# Patient Record
Sex: Female | Born: 1960 | Race: White | Hispanic: No | Marital: Married
Health system: Midwestern US, Community
[De-identification: ages and names within clinical notes are randomized; demographics above are authoritative.]

## PROBLEM LIST (undated history)

## (undated) DIAGNOSIS — K589 Irritable bowel syndrome without diarrhea: Secondary | ICD-10-CM

## (undated) DIAGNOSIS — T7840XA Allergy, unspecified, initial encounter: Secondary | ICD-10-CM

## (undated) DIAGNOSIS — F419 Anxiety disorder, unspecified: Secondary | ICD-10-CM

## (undated) DIAGNOSIS — H9201 Otalgia, right ear: Secondary | ICD-10-CM

## (undated) DIAGNOSIS — Z860100 Personal history of colon polyps, unspecified: Secondary | ICD-10-CM

## (undated) DIAGNOSIS — I10 Essential (primary) hypertension: Secondary | ICD-10-CM

## (undated) HISTORY — DX: Personal history of colon polyps, unspecified: Z86.0100

## (undated) HISTORY — DX: Irritable bowel syndrome, unspecified: K58.9

## (undated) HISTORY — DX: Personal history of colonic polyps: Z86.010

## (undated) HISTORY — DX: Allergy, unspecified, initial encounter: T78.40XA

## (undated) HISTORY — DX: Essential (primary) hypertension: I10

---

## 1993-12-05 LAB — HM PAP SMEAR

## 1997-11-14 ENCOUNTER — Emergency Department (HOSPITAL_COMMUNITY): Admission: EM | Admit: 1997-11-14 | Discharge: 1997-11-14 | Payer: Self-pay | Admitting: *Deleted

## 1998-03-25 ENCOUNTER — Other Ambulatory Visit: Admission: RE | Admit: 1998-03-25 | Discharge: 1998-03-25 | Payer: Self-pay | Admitting: Obstetrics and Gynecology

## 1998-06-04 ENCOUNTER — Ambulatory Visit (HOSPITAL_COMMUNITY): Admission: RE | Admit: 1998-06-04 | Discharge: 1998-06-04 | Payer: Self-pay | Admitting: Gastroenterology

## 1999-03-28 ENCOUNTER — Other Ambulatory Visit: Admission: RE | Admit: 1999-03-28 | Discharge: 1999-03-28 | Payer: Self-pay | Admitting: Obstetrics and Gynecology

## 1999-11-16 ENCOUNTER — Encounter: Payer: Self-pay | Admitting: Obstetrics and Gynecology

## 1999-11-16 ENCOUNTER — Encounter: Admission: RE | Admit: 1999-11-16 | Discharge: 1999-11-16 | Payer: Self-pay | Admitting: Obstetrics and Gynecology

## 2000-03-27 ENCOUNTER — Other Ambulatory Visit: Admission: RE | Admit: 2000-03-27 | Discharge: 2000-03-27 | Payer: Self-pay | Admitting: Obstetrics and Gynecology

## 2000-04-19 ENCOUNTER — Ambulatory Visit (HOSPITAL_COMMUNITY): Admission: EM | Admit: 2000-04-19 | Discharge: 2000-04-19 | Payer: Self-pay | Admitting: Emergency Medicine

## 2001-02-20 ENCOUNTER — Encounter: Admission: RE | Admit: 2001-02-20 | Discharge: 2001-02-20 | Payer: Self-pay | Admitting: Obstetrics and Gynecology

## 2001-02-20 ENCOUNTER — Encounter: Payer: Self-pay | Admitting: Obstetrics and Gynecology

## 2001-04-01 ENCOUNTER — Other Ambulatory Visit: Admission: RE | Admit: 2001-04-01 | Discharge: 2001-04-01 | Payer: Self-pay | Admitting: Obstetrics and Gynecology

## 2001-09-27 ENCOUNTER — Ambulatory Visit (HOSPITAL_COMMUNITY): Admission: RE | Admit: 2001-09-27 | Discharge: 2001-09-27 | Payer: Self-pay

## 2004-07-08 ENCOUNTER — Ambulatory Visit (HOSPITAL_COMMUNITY): Admission: RE | Admit: 2004-07-08 | Discharge: 2004-07-08 | Payer: Self-pay | Admitting: Gastroenterology

## 2005-04-19 ENCOUNTER — Encounter: Admission: RE | Admit: 2005-04-19 | Discharge: 2005-04-19 | Payer: Self-pay | Admitting: Family Medicine

## 2005-05-19 ENCOUNTER — Encounter: Admission: RE | Admit: 2005-05-19 | Discharge: 2005-05-19 | Payer: Self-pay | Admitting: Family Medicine

## 2006-10-09 ENCOUNTER — Ambulatory Visit: Payer: Self-pay | Admitting: Family Medicine

## 2007-02-05 ENCOUNTER — Ambulatory Visit: Payer: Self-pay | Admitting: Family Medicine

## 2007-03-15 ENCOUNTER — Ambulatory Visit: Payer: Self-pay | Admitting: Family Medicine

## 2007-04-11 ENCOUNTER — Emergency Department (HOSPITAL_COMMUNITY): Admission: EM | Admit: 2007-04-11 | Discharge: 2007-04-11 | Payer: Self-pay | Admitting: Emergency Medicine

## 2007-04-12 ENCOUNTER — Ambulatory Visit: Payer: Self-pay | Admitting: Family Medicine

## 2007-05-16 ENCOUNTER — Ambulatory Visit: Payer: Self-pay | Admitting: Family Medicine

## 2007-06-20 ENCOUNTER — Ambulatory Visit: Payer: Self-pay | Admitting: Family Medicine

## 2007-06-25 ENCOUNTER — Ambulatory Visit: Payer: Self-pay | Admitting: Family Medicine

## 2008-02-21 ENCOUNTER — Ambulatory Visit: Payer: Self-pay | Admitting: Family Medicine

## 2008-04-13 ENCOUNTER — Ambulatory Visit: Payer: Self-pay | Admitting: Family Medicine

## 2008-04-21 ENCOUNTER — Ambulatory Visit: Payer: Self-pay | Admitting: Family Medicine

## 2008-06-24 ENCOUNTER — Ambulatory Visit: Payer: Self-pay | Admitting: Family Medicine

## 2008-07-22 ENCOUNTER — Ambulatory Visit: Payer: Self-pay | Admitting: Family Medicine

## 2008-08-03 ENCOUNTER — Ambulatory Visit: Payer: Self-pay | Admitting: Family Medicine

## 2008-09-08 ENCOUNTER — Ambulatory Visit: Payer: Self-pay | Admitting: Family Medicine

## 2008-10-20 ENCOUNTER — Ambulatory Visit: Payer: Self-pay | Admitting: Family Medicine

## 2009-01-28 ENCOUNTER — Ambulatory Visit: Payer: Self-pay | Admitting: Family Medicine

## 2009-03-08 ENCOUNTER — Ambulatory Visit: Payer: Self-pay | Admitting: Sports Medicine

## 2009-03-08 DIAGNOSIS — Q667 Congenital pes cavus, unspecified foot: Secondary | ICD-10-CM | POA: Insufficient documentation

## 2009-03-08 DIAGNOSIS — M79609 Pain in unspecified limb: Secondary | ICD-10-CM | POA: Insufficient documentation

## 2009-03-08 DIAGNOSIS — M722 Plantar fascial fibromatosis: Secondary | ICD-10-CM | POA: Insufficient documentation

## 2009-03-09 ENCOUNTER — Ambulatory Visit: Payer: Self-pay | Admitting: Family Medicine

## 2009-03-26 ENCOUNTER — Ambulatory Visit: Payer: Self-pay | Admitting: Family Medicine

## 2009-05-08 HISTORY — PX: COLONOSCOPY: SHX174

## 2009-05-11 ENCOUNTER — Ambulatory Visit: Payer: Self-pay | Admitting: Sports Medicine

## 2009-05-12 ENCOUNTER — Ambulatory Visit: Payer: Self-pay | Admitting: Family Medicine

## 2009-06-04 ENCOUNTER — Ambulatory Visit: Payer: Self-pay | Admitting: Family Medicine

## 2009-06-25 ENCOUNTER — Encounter: Admission: RE | Admit: 2009-06-25 | Discharge: 2009-06-25 | Payer: Self-pay | Admitting: Family Medicine

## 2009-06-25 ENCOUNTER — Ambulatory Visit: Payer: Self-pay | Admitting: Family Medicine

## 2009-07-21 ENCOUNTER — Ambulatory Visit: Payer: Self-pay | Admitting: Family Medicine

## 2009-08-26 ENCOUNTER — Ambulatory Visit: Payer: Self-pay | Admitting: Family Medicine

## 2009-10-07 LAB — HM COLONOSCOPY

## 2009-11-16 ENCOUNTER — Ambulatory Visit: Payer: Self-pay | Admitting: Physician Assistant

## 2010-02-14 ENCOUNTER — Ambulatory Visit: Payer: Self-pay | Admitting: Family Medicine

## 2010-04-07 ENCOUNTER — Ambulatory Visit: Payer: Self-pay | Admitting: Family Medicine

## 2010-05-05 ENCOUNTER — Ambulatory Visit
Admission: RE | Admit: 2010-05-05 | Discharge: 2010-05-05 | Payer: Self-pay | Source: Home / Self Care | Attending: Family Medicine | Admitting: Family Medicine

## 2010-05-13 ENCOUNTER — Ambulatory Visit
Admission: RE | Admit: 2010-05-13 | Discharge: 2010-05-13 | Payer: Self-pay | Source: Home / Self Care | Attending: Family Medicine | Admitting: Family Medicine

## 2010-06-09 NOTE — Assessment & Plan Note (Signed)
Summary: NP FOOT PAIN REF FROM DR Jola Babinski OFF/[facility]   Vital Signs:  Patient profile:   50 year old female Height:      60 inches Weight:      125 pounds BMI:     24.50 BP sitting:   151 / 88  Vitals Entered By: Lillia Pauls CMA (March 08, 2009 10:51 AM)  History of Present Illness: Very pleasant lady referred courtesy of Dr Susann Givens she has had persistent left heel and arch pain This started without changing much of her training she normally runs 30 mins at a time 3 to 4 times per week some shoes without sarch support would cause pain as well pain on left was extesning up into lower leg and sometimes into mid arch  no swelling  RT heel was painful on occassion sometimes tight in morning minimal pain now  stiffenss first thing in morning meds on real help tennis ball massage some help  Physical Exam  General:  Well-developed,well-nourished,in no acute distress; alert,appropriate and cooperative throughout examination Msk:  Cavus foot bilat with high arch Left TTP left heel at medial insertion fat pad is OK testing of medial and lat tendons reveal good strength some tenderness in mid arch to stretch of PF AT is OK  RT minimal tenderness to touch AT is OK no pain on stretch Extremities:  Gait is high impact loud foot strike mid foot to forefoot impact supination bilat  note this feels worse in new shoes and OTC orthotics given to her   Impression & Recommendations:  Problem # 1:  FOOT PAIN, BILATERAL (ICD-729.5)  This has improved with shift back to old shoes will ask her  to use ice bath on left daily for pain relief  Orders: Sports Insoles (H0623)  Problem # 2:  PLANTAR FASCIITIS, LEFT (ICD-728.71)  This is moderate on left RT is more just arch strain at this point try arch strap given sports insoles and removed OTC orthotics  given std stretches and exercises gradual return to running I want to reck in 6 wks and if not improved would do [place]  scan  note she was able to run pretty comfortably with sports insole and strap when we placed these  Orders: Sports Insoles (L3510)  Problem # 3:  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

## 2010-06-09 NOTE — Assessment & Plan Note (Signed)
Summary: FU PLANTAR FASCIITIS AND NEW L GREAT TOE "SENSATION"   Vital Signs:  Patient profile:   50 year old female BP sitting:   121 / 87  Vitals Entered By: Lillia Pauls CMA (May 11, 2009 9:16 AM)  History of Present Illness: Left foot PF much better since last visit new complaint of some tingling on dorsum of left foot since we added scahphoid pad able to run without pain pain on days is now down to 2/3 out of 10 and absent some days mroning pain is much less and she does a stretch in bed and does not feel it  hard shoes will bring pain back out when standing  Physical Exam  General:  Well-developed,well-nourished,in no acute distress; alert,appropriate and cooperative throughout examination Msk:  minimal tenderness at insertion of PF has mild TMT bossing arch preserved no tenderness over AT or calcaneus   Impression & Recommendations:  Problem # 1:  PLANTAR FASCIITIS, LEFT (ICD-728.71) cont exercises as before at least 3 times per week do daily stretch as this helps given 2nd comforthotic and information about ordeing this and placing scaphoid pad loosen laces over TMT/ add B6 if numbness persists  if doing this well, gradually increase runnign and activity if stable can then return as needed but would suggest cont use of arch support  Problem # 2:  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

## 2010-06-09 NOTE — Assessment & Plan Note (Signed)
Summary: 9:30 appt for heel pain/hx plantar fasciitis/[facility]   Vital Signs:  Patient profile:   50 year old female BP sitting:   135 / 84  History of Present Illness: 50 yo F here for 2 1/2 week f/u of L plantar fasciitis.  Patient doing ice bath daily, wearing arch strap, sports insoles.  Also doing stretches and eccentric exercises.  Feels pain is the same or worse.  Would not like a shot at this time.  Unable to run because of pain.  Radiates across her foot.  Been present for about 3 months.  Physical Exam  General:  Well-developed,well-nourished,in no acute distress; alert,appropriate and cooperative throughout examination Msk:  Cavus foot bilaterally with high arch  Left No gross deformity, swelling, bruising. TTP left heel at medial insertion PF negative calcaneal squeeze and no TTP fat pad FROM with 5/5 strength all motions. some tenderness in mid arch to stretch of PF AT nontender   Impression & Recommendations:  Problem # 1:  PLANTAR FASCIITIS, LEFT (ICD-728.71) Assessment Deteriorated Additional felt lift added to left sports insole as well as a small scaphoid pad to help support the arch.  Given extra scaphoid pad to put on right side if starts to bother her on this side as well.  These felt comfortable to her.  Stressed importance of eccentric exercises, arch pad to help with short term pain.  Discussed natural history of plantar fasciitis.  Should stay as active as possible.  declined shot but advised we can try this if pain too severe.  F/u in 6 weeks otherwise.  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

## 2010-07-06 ENCOUNTER — Ambulatory Visit (INDEPENDENT_AMBULATORY_CARE_PROVIDER_SITE_OTHER): Payer: BC Managed Care – PPO | Admitting: Family Medicine

## 2010-07-06 DIAGNOSIS — J309 Allergic rhinitis, unspecified: Secondary | ICD-10-CM

## 2010-07-06 DIAGNOSIS — J0100 Acute maxillary sinusitis, unspecified: Secondary | ICD-10-CM

## 2010-09-23 NOTE — Procedures (Signed)
[facility]  Patient:    Desiree Yu, Desiree Yu                        MRN: [id number] Proc. Date: 04/19/00 Adm. Date:  [id number] Attending:  Donnetta Hutching CC:         Petra Kuba, M.D.  Ronnald Nian, M.D.   Procedure Report  PROCEDURE:  Video sigmoidoscopy.  INDICATIONS FOR PROCEDURE:  A 50 year old female with hematochezia and left lower quadrant abdominal pain and a reported history of prior ischemic colitis.  PREPARATION:  Two Fleets enemas administered in the GI unit.  SEDATION:  None.  DESCRIPTION OF PROCEDURE:  The Olympus video colonoscope was used, it was inserted via the rectum and advanced easily to 60 cm which appeared to be the splenic flexure. On withdrawal, the mucosa was carefully evaluated and found to be entirely normal. There were no signs of inflammation, diverticulosis, or ischemia. There was no neoplasia. Retroflexed view of the rectum was unremarkable. On withdrawal through the anal canal, there was apparently a small posterior fissure.  IMPRESSION:  Probable irritable bowel syndrome symptoms with an acute fissure accounting for bleeding.  PLAN:  High fiber diet, sitz baths, and Anusol suppositories are prescribed. Return to the office to follow-up with Dr. Ewing Schlein within the next few weeks. DD:  04/19/00 TD:  04/20/00 Job: [id number]

## 2010-09-23 NOTE — Op Note (Signed)
NAMEZHANA, JEANGILLES                 ACCOUNT NO.:  [id number]   MEDICAL RECORD NO.:  [id number]          PATIENT TYPE:  AMB   LOCATION:  ENDO                         FACILITY:  [facility]   PHYSICIAN:  Petra Kuba, M.D.    DATE OF BIRTH:  May 24, 1960   DATE OF PROCEDURE:  07/08/2004  DATE OF DISCHARGE:                                 OPERATIVE REPORT   PROCEDURE:  Colonoscopy.   INDICATIONS FOR PROCEDURE:  Change in bowel habits, family history of colon  polyps.   Consent was signed after risks, benefits, methods, and options were  thoroughly discussed in the office.   MEDICINES USED:  Demerol 90, Versed 9.   DESCRIPTION OF PROCEDURE:  Rectal inspection was pertinent for small  external hemorrhoids. Digital exam was negative. The pediatric video  adjustable colonoscope was inserted and fairly easily with some abdominal  pressure advanced around the colon to the cecum. No abnormalities was seen  on insertion. The cecum was identified by the appendiceal orifice and the  ileocecal valve. In fact, the scope was inserted a short ways into the  terminal ileum which was normal. Photo documentation was obtained. The scope  was slowly withdrawn.  On slow withdrawal back to the rectum, no  abnormalities were seen. Specifically, no polyp, tumors or masses or  diverticula.  Once back in the rectum, anal rectal pullthrough and  retroflexion confirmed some small hemorrhoids. The scope was straightened  and readvanced a short ways up the left side of the colon, air was  suctioned, scope removed. The patient tolerated the procedure well. There  was no obvious or immediate complications.   ENDOSCOPIC DIAGNOSIS:  1.  Internal and external small hemorrhoids.  2.  Otherwise within normal limits to the cecum and the terminal ileum.   PLAN:  Recheck screening in five years, happy to see back p.r.n. Return care  to Dr. Susann Givens for the customary health care screening to include yearly  rectals and  guaiacs.      MEM/MEDQ  D:  07/08/2004  T:  07/08/2004  Job:  [id number]   cc:   Sharlot Gowda, M.D.  [address]  [place], [place] [address]  Fax: [phone]

## 2010-09-23 NOTE — Consult Note (Signed)
[facility]  Patient:    Desiree Yu, Desiree Yu                        MRN: [id number] Proc. Date: 04/19/00 Adm. Date:  [id number] Attending:  Donnetta Hutching CC:         Ronnald Nian, M.D.  Petra Kuba, M.D.   Consultation Report  HISTORY OF PRESENT ILLNESS:  Desiree Yu is a 50 year old female who presents with acute onset of abdominal cramping with diarrhea.  Following the diarrhea, she had further diarrhea with four episodes of hematochezia and left lower quadrant abdominal pain.  She came to the emergency room because of this problem, in particular, because she has a reported history of ischemic colitis two years ago.  She is a former smoker but has not smoked for 10 years.  She did not have any fevers or chills.  When she had her cramping she said she felt nauseous but did not vomit.  In the emergency room her hemoglobin was 13.4.  She has been treated with antispasmodic medications, one of which is Anaspaz.  She was last seen six weeks ago by Dr. Ewing Schlein and outpatient stool guaiac tests were negative last week.  PHYSICAL EXAMINATION:  GENERAL:  She is in no acute distress.  VITAL SIGNS:  Afebrile with a blood pressure of 119/80, pulse 100 and regular.  HEENT:  Conjunctivae are pink.  CHEST:  Clear.  HEART:  Heart sounds are regular rate and rhythm without murmurs.  ABDOMEN:  Soft with minimal left lower quadrant tenderness to deep palpation. There is no rebound or hernia.  Bowel sounds are normal  RECTAL EXAM:  Brown stool.  Mucous is guaiac positive.  There is a small posterior anal fissure.  IMPRESSION:  Probable irritable bowel syndrome flare with fissure accounting for bleeding, however, in light of her history of ischemic colitis, I do believe she should undergo a sigmoidoscopy to make sure there is no further lesion found.  This will be arranged as soon as possible. DD:  04/19/00 TD:  04/20/00 Job: [id number]

## 2010-11-10 ENCOUNTER — Encounter: Payer: Self-pay | Admitting: Family Medicine

## 2010-11-11 ENCOUNTER — Ambulatory Visit (INDEPENDENT_AMBULATORY_CARE_PROVIDER_SITE_OTHER): Payer: BC Managed Care – PPO | Admitting: Family Medicine

## 2010-11-11 ENCOUNTER — Encounter: Payer: Self-pay | Admitting: Family Medicine

## 2010-11-11 VITALS — BP 150/100 | HR 68 | Wt 126.0 lb

## 2010-11-11 DIAGNOSIS — J301 Allergic rhinitis due to pollen: Secondary | ICD-10-CM

## 2010-11-11 DIAGNOSIS — E785 Hyperlipidemia, unspecified: Secondary | ICD-10-CM

## 2010-11-11 MED ORDER — AZELASTINE HCL 0.1 % NA SOLN: 2.0000 | Freq: Two times a day (BID) | NASAL | Status: DC

## 2010-11-11 MED ORDER — MOMETASONE FUROATE 50 MCG/ACT NA SUSP: 2.0000 | Freq: Every day | NASAL | Status: DC

## 2010-11-11 MED ORDER — ATORVASTATIN CALCIUM 20 MG PO TABS: 20.0000 mg | ORAL_TABLET | Freq: Every day | ORAL | Status: DC

## 2010-11-11 MED ORDER — MONTELUKAST SODIUM 10 MG PO TABS: 10.0000 mg | ORAL_TABLET | Freq: Every day | ORAL | Status: DC

## 2010-11-11 NOTE — Patient Instructions (Signed)
Use the medication for one month and then call me. If you keep having difficulty, I will refer to an allergist

## 2010-11-11 NOTE — Progress Notes (Signed)
  Subjective:    Patient ID: Desiree Yu, female    DOB: 09/12/1960, 49 y.o.   MRN: [id number]  HPI She is here for evaluation of continued difficulty with sinus congestion, PND, nasal congestion, popping in the ears. She notes this is especially true at night. Flonase does not give her complete relief. The symptoms have been especially troublesome this year. She does not smoke. Has tried loratadine, Allegra and Zyrtec. No animals in the house. The symptoms also occur when she is out of town. She would also like a refill on her Lipitor. She is doing well on this medicine.  Review of Systems     Objective:   Physical Exam alert and in no distress. Tympanic membranes and canals are normal. Throat is clear. Tonsils are normal. Neck is supple without adenopathy or thyromegaly. Cardiac exam shows a regular sinus rhythm without murmurs or gallops. Lungs are clear to auscultation. Nasal mucosa is normal with no tenderness over sinuses        Assessment & Plan:  Allergic rhinitis. Hyperlipidemia. I will place her on Astelin and gave proper instructions on use of this medicine and potential problems. Will also give her Singulair and switch her to Nasonex. Lipitor was also called in.

## 2010-12-01 ENCOUNTER — Other Ambulatory Visit: Payer: Self-pay | Admitting: Family Medicine

## 2010-12-09 ENCOUNTER — Encounter: Payer: Self-pay | Admitting: Family Medicine

## 2010-12-19 ENCOUNTER — Telehealth: Payer: Self-pay | Admitting: Family Medicine

## 2010-12-29 NOTE — Telephone Encounter (Deleted)
JCL HAS THIS BEEN TAKEN CARE OF?

## 2010-12-30 NOTE — Telephone Encounter (Signed)
done

## 2011-01-11 ENCOUNTER — Encounter: Payer: Self-pay | Admitting: Family Medicine

## 2011-01-11 ENCOUNTER — Ambulatory Visit (INDEPENDENT_AMBULATORY_CARE_PROVIDER_SITE_OTHER): Payer: BC Managed Care – PPO | Admitting: Family Medicine

## 2011-01-11 VITALS — BP 140/90 | HR 84 | Temp 98.8°F | Wt 128.0 lb

## 2011-01-11 DIAGNOSIS — J301 Allergic rhinitis due to pollen: Secondary | ICD-10-CM

## 2011-01-11 NOTE — Progress Notes (Signed)
  Subjective:    Patient ID: Desiree Yu, female    DOB: 30-Mar-1961, 50 y.o.   MRN: [id number]  HPI She continues to have difficulty with nasal congestion, postnasal drainage. She continues on Astelin as well as Nasonex. She did try Singulair and states it had very little effect.   Review of Systems     Objective:   Physical Exam alert and in no distress. Tympanic membranes and canals are normal. Throat is clear. Tonsils are normal. Neck is supple without adenopathy or thyromegaly.        Assessment & Plan:  Allergic rhinitis Refer to allergist for further evaluation and treatment.

## 2011-02-13 LAB — COMPREHENSIVE METABOLIC PANEL
ALT: 18
AST: 24
Albumin: 3.9
Alkaline Phosphatase: 43
BUN: 13
CO2: 25
Calcium: 9.3
Chloride: 105
Creatinine, Ser: 0.68
GFR calc Af Amer: >60
GFR calc non Af Amer: >60
Glucose, Bld: 109 — ABNORMAL HIGH
Potassium: 3.8
Sodium: 138
Total Bilirubin: 1.2
Total Protein: 6.9

## 2011-02-13 LAB — URINE MICROSCOPIC-ADD ON

## 2011-02-13 LAB — URINALYSIS, ROUTINE W REFLEX MICROSCOPIC
Bilirubin Urine: NEGATIVE
Glucose, UA: NEGATIVE
Ketones, ur: NEGATIVE
Nitrite: NEGATIVE
Protein, ur: 100 — AB
Specific Gravity, Urine: 1.024
Urobilinogen, UA: 0.2
pH: 6.5

## 2011-02-13 LAB — CBC
HCT: 41.2
Hemoglobin: 14.1
MCHC: 34.3
MCV: 96
Platelets: 366
RBC: 4.29
RDW: 12.7
WBC: 8.1

## 2011-02-13 LAB — LIPASE, BLOOD: Lipase: 22

## 2011-02-13 LAB — POCT PREGNANCY, URINE
Operator id: 19830
Preg Test, Ur: NEGATIVE

## 2011-04-17 ENCOUNTER — Encounter: Payer: Self-pay | Admitting: Family Medicine

## 2011-04-17 ENCOUNTER — Ambulatory Visit (INDEPENDENT_AMBULATORY_CARE_PROVIDER_SITE_OTHER): Payer: BC Managed Care – PPO | Admitting: Family Medicine

## 2011-04-17 VITALS — BP 130/78 | HR 78 | Wt 130.0 lb

## 2011-04-17 DIAGNOSIS — M549 Dorsalgia, unspecified: Secondary | ICD-10-CM

## 2011-04-17 DIAGNOSIS — M546 Pain in thoracic spine: Secondary | ICD-10-CM

## 2011-04-17 MED ORDER — CARISOPRODOL 350 MG PO TABS: 350.0000 mg | ORAL_TABLET | Freq: Three times a day (TID) | ORAL | Status: AC | PRN

## 2011-04-17 NOTE — Patient Instructions (Signed)
Use heat and stretching, uses soma mainly at night. Return here for possible injection if not improved

## 2011-04-17 NOTE — Progress Notes (Signed)
  Subjective:    Patient ID: Desiree Yu, female    DOB: 1961/04/12, 50 y.o.   MRN: [id number]  HPI She is here for evaluation of continued difficulty with upper back discomfort. She has seen her massage therapist and has had no luck in eliminating the pain. It sometimes will cause radiation of the pain.   Review of Systems     Objective:   Physical Exam Alert and in no distress. Exam of her upper back does show some palpable trigger points.      Assessment & Plan:  Triggerpoints. Discussed heat, stretching and will use soma. If she continues to have difficulty did mention the possibility of an injection. She will let he know if she does not respond to the soma.

## 2011-05-10 ENCOUNTER — Encounter: Payer: Self-pay | Admitting: Family Medicine

## 2011-05-10 ENCOUNTER — Ambulatory Visit
Admission: RE | Admit: 2011-05-10 | Discharge: 2011-05-10 | Disposition: A | Discharge status: Home / Self Care | Payer: BC Managed Care – PPO | Source: Ambulatory Visit | Attending: Family Medicine | Admitting: Family Medicine

## 2011-05-10 ENCOUNTER — Ambulatory Visit (INDEPENDENT_AMBULATORY_CARE_PROVIDER_SITE_OTHER): Payer: BC Managed Care – PPO | Admitting: Family Medicine

## 2011-05-10 VITALS — BP 122/80 | HR 78 | Wt 129.0 lb

## 2011-05-10 DIAGNOSIS — G8929 Other chronic pain: Secondary | ICD-10-CM

## 2011-05-10 DIAGNOSIS — M549 Dorsalgia, unspecified: Secondary | ICD-10-CM

## 2011-05-10 NOTE — Progress Notes (Signed)
  Subjective:    Patient ID: Desiree Yu, female    DOB: 28-Dec-1960, 51 y.o.   MRN: [id number]  HPI She is here for recheck. She continues to have difficulty with right upper back and scapular discomfort. She has not obtained any relief with using massage or stretching. She has no chest pain, shortness of breath, DOE.  Review of Systems     Objective:   Physical Exam Alert and in no distress. Lungs are clear to auscultation. Exam of her back shows no palpable lesions.       Assessment & Plan:   1. Upper back pain, chronic  DG Chest 2 View   if the x-ray is negative, I will have her see if chiropractic manipulation will help. Followup as needed

## 2011-05-10 NOTE — Patient Instructions (Signed)
If the x-ray is negative, see Damien Rudolpho.

## 2011-06-01 ENCOUNTER — Telehealth: Payer: Self-pay | Admitting: Family Medicine

## 2011-06-01 NOTE — Telephone Encounter (Signed)
Go ahead and draw labs and  I'll look at them and call her

## 2011-06-02 NOTE — Telephone Encounter (Signed)
Left message for pt to come in and get blood draw per jcl

## 2011-06-13 ENCOUNTER — Other Ambulatory Visit: Payer: BC Managed Care – PPO

## 2011-06-13 DIAGNOSIS — E785 Hyperlipidemia, unspecified: Secondary | ICD-10-CM

## 2011-06-13 LAB — LIPID PANEL
Cholesterol: 163 mg/dL (ref 0–200)
HDL: 74 mg/dL (ref >39)
LDL Cholesterol: 75 mg/dL (ref 0–99)
Total CHOL/HDL Ratio: 2.2 Ratio
Triglycerides: 72 mg/dL (ref <150)
VLDL: 14 mg/dL (ref 0–40)

## 2011-06-13 LAB — HEPATIC FUNCTION PANEL
ALT: 18 U/L (ref 0–35)
AST: 26 U/L (ref 0–37)
Albumin: 4.8 g/dL (ref 3.5–5.2)
Alkaline Phosphatase: 55 U/L (ref 39–117)
Bilirubin, Direct: 0.2 mg/dL (ref 0.0–0.3)
Indirect Bilirubin: 0.8 mg/dL (ref 0.0–0.9)
Total Bilirubin: 1 mg/dL (ref 0.3–1.2)
Total Protein: 7.1 g/dL (ref 6.0–8.3)

## 2011-06-30 ENCOUNTER — Other Ambulatory Visit: Payer: Self-pay | Admitting: Family Medicine

## 2011-06-30 MED ORDER — ATORVASTATIN CALCIUM 20 MG PO TABS: 20.0000 mg | ORAL_TABLET | Freq: Every day | ORAL | Status: DC

## 2011-06-30 NOTE — Telephone Encounter (Signed)
Pt called and said at time of her labs, that her Lipitor was not refilled.  Please refill to [facility] on [address].

## 2011-12-07 ENCOUNTER — Encounter: Payer: Self-pay | Admitting: Family Medicine

## 2011-12-26 ENCOUNTER — Ambulatory Visit (INDEPENDENT_AMBULATORY_CARE_PROVIDER_SITE_OTHER): Payer: BC Managed Care – PPO | Admitting: Family Medicine

## 2011-12-26 ENCOUNTER — Other Ambulatory Visit: Payer: Self-pay

## 2011-12-26 ENCOUNTER — Encounter: Payer: Self-pay | Admitting: Family Medicine

## 2011-12-26 VITALS — BP 112/70 | HR 74 | Wt 133.0 lb

## 2011-12-26 DIAGNOSIS — M549 Dorsalgia, unspecified: Secondary | ICD-10-CM

## 2011-12-26 MED ORDER — CYCLOBENZAPRINE HCL 5 MG PO TABS: 5.0000 mg | ORAL_TABLET | Freq: Three times a day (TID) | ORAL | Status: AC | PRN

## 2011-12-26 NOTE — Patient Instructions (Signed)
Use of Flexeril 2 or 3 times a day depending upon, sedation. You can't take this while driving.

## 2011-12-26 NOTE — Progress Notes (Signed)
  Subjective:    Patient ID: Desiree Yu, female    DOB: [birth year], 51 y.o.   MRN: [id number]  HPI She is here for consultation. She continues to have upper back and shoulder discomfort. She has been seeing a chiropractor regularly for the last several months. He referred her to Dr. Modesto Charon who prescribed Mobic and Skelaxin which had minimal relief. She is also had an MRI on her upper back which apparently did not show much. She was given Flexeril by her gynecologist and states that this helped more than anything. She is now going to [facility] help. She is interested in my thoughts on this.   Review of Systems     Objective:   Physical Exam Alert and in no distress otherwise not examined       Assessment & Plan:   1. Back pain  cyclobenzaprine (FLEXERIL) 5 MG tablet   approximately 15 minutes spent discussing all these issues with her. I recommended that she continue on Flexeril on a regular basis to see if a more continuous use of this will have more benefit. Also discussed the possibility of seeing a different chiropractor did go for more manipulation is post immobilization. Gave her names of several chiropractors. I will followup based on her need.

## 2011-12-26 NOTE — Telephone Encounter (Signed)
CALLED FLEXAREL IN PER JCL

## 2012-01-05 ENCOUNTER — Other Ambulatory Visit: Payer: Self-pay | Admitting: Family Medicine

## 2012-05-21 ENCOUNTER — Ambulatory Visit (INDEPENDENT_AMBULATORY_CARE_PROVIDER_SITE_OTHER): Payer: BC Managed Care – PPO | Admitting: Family Medicine

## 2012-05-21 ENCOUNTER — Encounter: Payer: Self-pay | Admitting: Family Medicine

## 2012-05-21 VITALS — BP 130/78 | HR 80 | Wt 138.0 lb

## 2012-05-21 DIAGNOSIS — M549 Dorsalgia, unspecified: Secondary | ICD-10-CM

## 2012-05-21 MED ORDER — VENLAFAXINE HCL 37.5 MG PO TABS: 37.5000 mg | ORAL_TABLET | Freq: Two times a day (BID) | ORAL | Status: DC

## 2012-05-21 MED ORDER — VENLAFAXINE HCL 75 MG PO TABS: 75.0000 mg | ORAL_TABLET | Freq: Two times a day (BID) | ORAL | Status: DC

## 2012-05-21 NOTE — Patient Instructions (Signed)
Take the 37.5 mg pills for the first week and then go to 75 mg pill and then I will see you in roughly a month. You can take Tylenol/Advil as well.

## 2012-05-21 NOTE — Progress Notes (Signed)
  Subjective:    Patient ID: Desiree Yu, female    DOB: [birth year], 52 y.o.   MRN: [id number]  HPI She is here for consult concerning continued difficulty with mid back pain. Review his record indicates she has been seen by physical therapy, orthopedics, chiropractic as well as having massage therapy done. She also notes that stress does play a role in this. She did respond with some relief of her symptoms when she was given a lorazepam. She is interested in any further options that she has.   Review of Systems     Objective:   Physical Exam Alert and slightly tearful. Back exam not done.       Assessment & Plan:   1. Mid back pain  venlafaxine (EFFEXOR) 37.5 MG tablet, venlafaxine (EFFEXOR) 75 MG tablet   after discussion with her concerning options we have decided to try Effexor to see if this will help with her pain. Also discussed use of Cymbalta. Discussed also possibility of reexamining her in regard to possible MRI and looking in other areas of her back as to the source of her midthoracic pain. He mentioned the concept of closed chain. I will start her on Effexor 37.5 twice a day increasing to 75 twice a day. She will return here in one month.

## 2012-06-07 ENCOUNTER — Encounter: Payer: Self-pay | Admitting: Internal Medicine

## 2012-06-21 ENCOUNTER — Ambulatory Visit: Payer: BC Managed Care – PPO | Admitting: Family Medicine

## 2012-07-30 ENCOUNTER — Ambulatory Visit (INDEPENDENT_AMBULATORY_CARE_PROVIDER_SITE_OTHER): Payer: BC Managed Care – PPO | Admitting: Family Medicine

## 2012-07-30 ENCOUNTER — Encounter: Payer: Self-pay | Admitting: Family Medicine

## 2012-07-30 VITALS — BP 130/90 | HR 89 | Wt 137.0 lb

## 2012-07-30 DIAGNOSIS — E785 Hyperlipidemia, unspecified: Secondary | ICD-10-CM

## 2012-07-30 DIAGNOSIS — M549 Dorsalgia, unspecified: Secondary | ICD-10-CM

## 2012-07-30 MED ORDER — VENLAFAXINE HCL 75 MG PO TABS: 75.0000 mg | ORAL_TABLET | Freq: Two times a day (BID) | ORAL | Status: DC

## 2012-07-30 NOTE — Progress Notes (Signed)
  Subjective:    Patient ID: Desiree Yu, female    DOB: 10-Dec-1960, 52 y.o.   MRN: [id number]  HPI She is here for recheck on back pain. She states she is getting better. She is using massage, yoga and  an exercise program.she also needs a refill on her Effexor. She does think that it helps make her more calm and relaxed. She also stopped taking Lipitor thinking that might contribute to her back pain. She has been making dietary changes specifically eating more oatmeal.   Review of Systems     Objective:   Physical Exam Alert and in no distress otherwise not examined       Assessment & Plan:  Mid back pain  Hyperlipidemia LDL goal < 100 - Plan: Lipid panel encouraged her to continue with her present regimen for the back. Discussed possibly stopping the Effexor only after she is pain free for several months. Also discussed the fact that I would not stop this in the holidays. I will see how her new diet has worked on her lipid panel.

## 2012-07-31 LAB — LIPID PANEL
Cholesterol: 263 mg/dL — ABNORMAL HIGH (ref 0–200)
HDL: 69 mg/dL (ref >39)
LDL Cholesterol: 170 mg/dL — ABNORMAL HIGH (ref 0–99)
Total CHOL/HDL Ratio: 3.8 Ratio
Triglycerides: 121 mg/dL (ref <150)
VLDL: 24 mg/dL (ref 0–40)

## 2012-08-01 NOTE — Progress Notes (Signed)
Quick Note:  CALLED PT CELL # LEFT RESULTS OF   Her lipids are not at goal. Have her start back on Lipitor or if she would like we can try Crestor and then we'll need to recheck that in 2 months  TO CALL ME BACK AND LET ME KNOW WHAT SHE WOULD LIKE TO DO  ______

## 2012-08-05 ENCOUNTER — Other Ambulatory Visit: Payer: Self-pay

## 2012-08-05 MED ORDER — ATORVASTATIN CALCIUM 20 MG PO TABS: ORAL_TABLET | ORAL | Status: DC

## 2012-08-05 NOTE — Telephone Encounter (Signed)
PT CALLED ON 3/27 LEFT MESSAGE THAT SHE WANTED TO GO BACK ON LIPITOR SO I SENT MED IN PER JCL

## 2012-11-05 ENCOUNTER — Ambulatory Visit (INDEPENDENT_AMBULATORY_CARE_PROVIDER_SITE_OTHER): Payer: BC Managed Care – PPO | Admitting: Family Medicine

## 2012-11-05 ENCOUNTER — Encounter: Payer: Self-pay | Admitting: Family Medicine

## 2012-11-05 VITALS — BP 120/84 | HR 76 | Wt 137.0 lb

## 2012-11-05 DIAGNOSIS — M549 Dorsalgia, unspecified: Secondary | ICD-10-CM

## 2012-11-05 DIAGNOSIS — E785 Hyperlipidemia, unspecified: Secondary | ICD-10-CM

## 2012-11-05 DIAGNOSIS — Z79899 Other long term (current) drug therapy: Secondary | ICD-10-CM

## 2012-11-05 LAB — COMPREHENSIVE METABOLIC PANEL
ALT: 15 U/L (ref 0–35)
AST: 20 U/L (ref 0–37)
Albumin: 4.6 g/dL (ref 3.5–5.2)
Alkaline Phosphatase: 68 U/L (ref 39–117)
BUN: 12 mg/dL (ref 6–23)
CO2: 25 mEq/L (ref 19–32)
Calcium: 9.6 mg/dL (ref 8.4–10.5)
Chloride: 103 mEq/L (ref 96–112)
Creat: 0.53 mg/dL (ref 0.50–1.10)
Glucose, Bld: 85 mg/dL (ref 70–99)
Potassium: 4.2 mEq/L (ref 3.5–5.3)
Sodium: 137 mEq/L (ref 135–145)
Total Bilirubin: 0.7 mg/dL (ref 0.3–1.2)
Total Protein: 7 g/dL (ref 6.0–8.3)

## 2012-11-05 LAB — LIPID PANEL
Cholesterol: 165 mg/dL (ref 0–200)
HDL: 80 mg/dL (ref >39)
LDL Cholesterol: 75 mg/dL (ref 0–99)
Total CHOL/HDL Ratio: 2.1 Ratio
Triglycerides: 52 mg/dL (ref <150)
VLDL: 10 mg/dL (ref 0–40)

## 2012-11-05 MED ORDER — ESZOPICLONE 3 MG PO TABS: 3.0000 mg | ORAL_TABLET | Freq: Every day | ORAL | Status: DC

## 2012-11-05 NOTE — Progress Notes (Signed)
  Subjective:    Patient ID: Desiree Yu, female    DOB: 04/25/1961, 52 y.o.   MRN: [id number]  HPI She is here for consultation. She is getting ready to go on a trip to [place] and would like some sleep medications to help with the transition. She also states that her back pains are slowly getting better and has backed off on her Effexor. She finds that a combination of therapies including massage, yoga and stretching has helped. She has tried acupuncture without much relief. She was also started on Lipitor in March for treatment of hyperlipidemia. She's had no difficulties with this medication and is here for recheck on that.  Review of Systems     Objective:   Physical Exam Alert and in no distress with appropriate affect       Assessment & Plan:  Hyperlipidemia LDL goal < 100 - Plan: Comprehensive metabolic panel, Lipid panel  Encounter for long-term (current) use of other medications - Plan: Comprehensive metabolic panel, Lipid panel  Mid back pain  sample of Lunesta given to help with her trip. Encouraged her to continue with her present regimen concerning her back.

## 2012-11-06 ENCOUNTER — Other Ambulatory Visit: Payer: Self-pay

## 2012-11-06 MED ORDER — ATORVASTATIN CALCIUM 20 MG PO TABS: ORAL_TABLET | ORAL | Status: DC

## 2012-11-06 NOTE — Progress Notes (Signed)
Quick Note:  MAILED PT LETTER WITH RESULTS AND SENT MED IN FOR 1 YEAR ______

## 2012-11-06 NOTE — Telephone Encounter (Signed)
SENT STATIN IN FOR 1 YEAR

## 2013-01-24 ENCOUNTER — Ambulatory Visit (INDEPENDENT_AMBULATORY_CARE_PROVIDER_SITE_OTHER): Payer: BC Managed Care – PPO | Admitting: Family Medicine

## 2013-01-24 ENCOUNTER — Encounter: Payer: Self-pay | Admitting: Family Medicine

## 2013-01-24 VITALS — BP 120/84 | HR 90 | Wt 139.0 lb

## 2013-01-24 DIAGNOSIS — Z23 Encounter for immunization: Secondary | ICD-10-CM

## 2013-01-24 DIAGNOSIS — M659 Synovitis and tenosynovitis, unspecified: Secondary | ICD-10-CM

## 2013-01-24 DIAGNOSIS — M67361 Transient synovitis, right knee: Secondary | ICD-10-CM

## 2013-01-24 NOTE — Progress Notes (Signed)
  Subjective:    Patient ID: Desiree Yu, female    DOB: 24-Feb-1961, 52 y.o.   MRN: [id number]  HPI She complains of a two-day history of swelling in the right knee. No history of injury or other joints involved. No popping, locking or grinding.    Review of Systems     Objective:   Physical Exam The right knee is not hot tender or red. An effusion is noted. No joint tenderness. No pain on palpation of the patella. Anterior drawer and McMurray's testing negative.       Assessment & Plan:  Transient synovitis of right knee  Need for prophylactic vaccination and inoculation against influenza - Plan: Flu Vaccine QUAD 36+ mos IM  recommend 2 Aleve twice a day for the next 10 days. If no improvement, reevaluation

## 2013-01-24 NOTE — Patient Instructions (Signed)
Take 2 Aleve twice a day for the next 10 days and is still having symptoms, make another appointment.

## 2013-06-12 ENCOUNTER — Ambulatory Visit (INDEPENDENT_AMBULATORY_CARE_PROVIDER_SITE_OTHER): Payer: BC Managed Care – PPO | Admitting: Family Medicine

## 2013-06-12 ENCOUNTER — Encounter: Payer: Self-pay | Admitting: Family Medicine

## 2013-06-12 VITALS — BP 130/72 | HR 80 | Wt 141.0 lb

## 2013-06-12 DIAGNOSIS — E782 Mixed hyperlipidemia: Secondary | ICD-10-CM

## 2013-06-12 DIAGNOSIS — F4389 Other reactions to severe stress: Secondary | ICD-10-CM

## 2013-06-12 DIAGNOSIS — Z569 Unspecified problems related to employment: Secondary | ICD-10-CM

## 2013-06-12 DIAGNOSIS — Z566 Other physical and mental strain related to work: Secondary | ICD-10-CM

## 2013-06-12 DIAGNOSIS — F438 Other reactions to severe stress: Secondary | ICD-10-CM

## 2013-06-12 MED ORDER — ALPRAZOLAM 0.25 MG PO TABS: 0.2500 mg | ORAL_TABLET | Freq: Two times a day (BID) | ORAL | Status: DC | PRN

## 2013-06-12 NOTE — Progress Notes (Signed)
   Subjective:    Patient ID: Desiree Yu, female    DOB: July 18, 1960, 53 y.o.   MRN: [id number]  HPI She is here for consult. She has questions about her cholesterol however the last numbers were in July and were quite good. She also has been under a lot of work-related stress. She has made the decision to retire in April. The work environment has become much more stressful than she is comfortable dealing with. She has a good game plan for what she is going to do when she retires. She would like some help with handling the stress until she retires.   Review of Systems     Objective:   Physical Exam Alert and in no distress with appropriate affect       Assessment & Plan:  Work-related stress - Plan: ALPRAZolam (XANAX) 0.25 MG tablet  she has a very good handle on how to handle things. The work environment that she is in is quite stressful and I think her approach is certainly reasonable. She will keep her options open after 6 months and then potentially look for another less stressful position.

## 2013-09-03 ENCOUNTER — Telehealth: Payer: Self-pay

## 2013-09-03 NOTE — Telephone Encounter (Signed)
Pt states that she is getting ready to go out of the country soon and would like to know if you can Rx her something to regulate her sleep as you have done in the past. Please advise

## 2013-09-04 MED ORDER — ZOLPIDEM TARTRATE 10 MG PO TABS: 10.0000 mg | ORAL_TABLET | Freq: Every evening | ORAL | Status: DC | PRN

## 2013-09-04 NOTE — Telephone Encounter (Signed)
Called in [place] to [facility]

## 2013-09-04 NOTE — Telephone Encounter (Signed)
Call in Ambien 10 mg #12. One at bedtime when necessary sleep

## 2013-11-12 ENCOUNTER — Other Ambulatory Visit: Payer: Self-pay | Admitting: Family Medicine

## 2013-11-12 NOTE — Telephone Encounter (Signed)
PATIENT NEEDS TO SCHEDULE A FOLLOW UP APPOINTMENT WITH DR. Susann GivensLALONDE BEFORE HER MEDICATION RUNS OUT.

## 2014-02-14 ENCOUNTER — Other Ambulatory Visit: Payer: Self-pay | Admitting: Family Medicine

## 2014-03-17 ENCOUNTER — Ambulatory Visit (INDEPENDENT_AMBULATORY_CARE_PROVIDER_SITE_OTHER): Payer: BC Managed Care – PPO | Admitting: Family Medicine

## 2014-03-17 ENCOUNTER — Encounter: Payer: Self-pay | Admitting: Family Medicine

## 2014-03-17 VITALS — BP 120/80 | HR 73 | Wt 138.0 lb

## 2014-03-17 DIAGNOSIS — Z79899 Other long term (current) drug therapy: Secondary | ICD-10-CM

## 2014-03-17 DIAGNOSIS — Z23 Encounter for immunization: Secondary | ICD-10-CM

## 2014-03-17 DIAGNOSIS — E785 Hyperlipidemia, unspecified: Secondary | ICD-10-CM

## 2014-03-17 DIAGNOSIS — J301 Allergic rhinitis due to pollen: Secondary | ICD-10-CM

## 2014-03-17 LAB — CBC WITH DIFFERENTIAL/PLATELET
Basophils Absolute: 0.1 10*3/uL (ref 0.0–0.1)
Basophils Relative: 1 % (ref 0–1)
Eosinophils Absolute: 0.1 10*3/uL (ref 0.0–0.7)
Eosinophils Relative: 2 % (ref 0–5)
HCT: 40.5 % (ref 36.0–46.0)
Hemoglobin: 13.5 g/dL (ref 12.0–15.0)
Lymphocytes Relative: 38 % (ref 12–46)
Lymphs Abs: 2.4 10*3/uL (ref 0.7–4.0)
MCH: 31.3 pg (ref 26.0–34.0)
MCHC: 33.3 g/dL (ref 30.0–36.0)
MCV: 94 fL (ref 78.0–100.0)
Monocytes Absolute: 0.6 10*3/uL (ref 0.1–1.0)
Monocytes Relative: 10 % (ref 3–12)
Neutro Abs: 3.1 10*3/uL (ref 1.7–7.7)
Neutrophils Relative %: 49 % (ref 43–77)
Platelets: 374 10*3/uL (ref 150–400)
RBC: 4.31 MIL/uL (ref 3.87–5.11)
RDW: 14 % (ref 11.5–15.5)
WBC: 6.3 10*3/uL (ref 4.0–10.5)

## 2014-03-17 LAB — COMPREHENSIVE METABOLIC PANEL
ALT: 13 U/L (ref 0–35)
AST: 17 U/L (ref 0–37)
Albumin: 4.8 g/dL (ref 3.5–5.2)
Alkaline Phosphatase: 72 U/L (ref 39–117)
BUN: 15 mg/dL (ref 6–23)
CO2: 26 mEq/L (ref 19–32)
Calcium: 9.7 mg/dL (ref 8.4–10.5)
Chloride: 103 mEq/L (ref 96–112)
Creat: 0.58 mg/dL (ref 0.50–1.10)
Glucose, Bld: 104 mg/dL — ABNORMAL HIGH (ref 70–99)
Potassium: 4.5 mEq/L (ref 3.5–5.3)
Sodium: 138 mEq/L (ref 135–145)
Total Bilirubin: 0.9 mg/dL (ref 0.2–1.2)
Total Protein: 7.1 g/dL (ref 6.0–8.3)

## 2014-03-17 LAB — LIPID PANEL
Cholesterol: 160 mg/dL (ref 0–200)
HDL: 79 mg/dL (ref >39)
LDL Cholesterol: 61 mg/dL (ref 0–99)
Total CHOL/HDL Ratio: 2 Ratio
Triglycerides: 99 mg/dL (ref <150)
VLDL: 20 mg/dL (ref 0–40)

## 2014-03-17 NOTE — Progress Notes (Signed)
   Subjective:    Patient ID: Desiree Yu, female    DOB: 1960/12/23, 53 y.o.   MRN: [id number]  HPI She is here for an interval evaluation. She continues to have difficulty with her back. She is seeing a massage therapist who thinks this could be hip and pelvic dysfunction causing her upper back pain. She is slowly getting better with massage and chiropractic help. In the past she has had some physical therapy. Her allergies are under good control. She continues on Lipitor and is having no difficulty with that. She did retire from her work at [employer]. She has started doing some consulting work.she gets regular gynecologic follow-up   Review of Systems     Objective:   Physical Exam alert and in no distress. Tympanic membranes and canals are normal. Throat is clear. Tonsils are normal. Neck is supple without adenopathy or thyromegaly. Cardiac exam shows a regular sinus rhythm without murmurs or gallops. Lungs are clear to auscultation.        Assessment & Plan:  Need for prophylactic vaccination and inoculation against influenza - Plan: Flu Vaccine QUAD 36+ mos IM  Hyperlipidemia with target LDL less than 100 - Plan: Lipid panel  Allergic rhinitis due to pollen  Encounter for long-term (current) use of medications - Plan: CBC with Differential, Comprehensive metabolic panel, Lipid panel encouraged her to stay physically and mentally engaged especially since she is now retired. Also encouraged her to continue with massage and chiropractic since it seems to be helping.

## 2014-03-19 ENCOUNTER — Other Ambulatory Visit: Payer: Self-pay

## 2014-03-19 MED ORDER — ATORVASTATIN CALCIUM 20 MG PO TABS: ORAL_TABLET | ORAL | Status: DC

## 2014-09-17 ENCOUNTER — Ambulatory Visit (INDEPENDENT_AMBULATORY_CARE_PROVIDER_SITE_OTHER): Payer: BC Managed Care – PPO | Admitting: Family Medicine

## 2014-09-17 ENCOUNTER — Encounter: Payer: Self-pay | Admitting: Family Medicine

## 2014-09-17 VITALS — BP 138/84 | HR 65 | Wt 139.0 lb

## 2014-09-17 DIAGNOSIS — G479 Sleep disorder, unspecified: Secondary | ICD-10-CM

## 2014-09-17 DIAGNOSIS — Z23 Encounter for immunization: Secondary | ICD-10-CM

## 2014-09-17 MED ORDER — ALPRAZOLAM 0.25 MG PO TABS: 0.2500 mg | ORAL_TABLET | ORAL | Status: DC | PRN

## 2014-09-17 MED ORDER — ZOLPIDEM TARTRATE 10 MG PO TABS: 10.0000 mg | ORAL_TABLET | Freq: Every evening | ORAL | Status: DC | PRN

## 2014-09-17 NOTE — Progress Notes (Signed)
   Subjective:    Patient ID: Desiree Yu, female    DOB: 18-Nov-1960, 54 y.o.   MRN: [id number]  HPI She is here for consult. She is going to be spending a month in [place] in the near future. She does have some slight difficulty with anxiety and flying. She would like Xanax for this. She also will need some Ambien to help with sleep disturbance due to time zone changes. Review his record indicates she also needs an immunization update.   Review of Systems     Objective:   Physical Exam Alert and in no distress otherwise not examined       Assessment & Plan:  Need for Tdap vaccination - Plan: Tdap vaccine greater than or equal to 7yo IM  Sleep disturbance - Plan: zolpidem (AMBIEN) 10 MG tablet

## 2014-09-26 ENCOUNTER — Other Ambulatory Visit: Payer: Self-pay | Admitting: Family Medicine

## 2015-02-22 LAB — HM COLONOSCOPY

## 2015-02-25 ENCOUNTER — Encounter: Payer: Self-pay | Admitting: Family Medicine

## 2015-04-07 ENCOUNTER — Other Ambulatory Visit: Payer: Self-pay | Admitting: Family Medicine

## 2015-05-20 ENCOUNTER — Telehealth: Payer: Self-pay

## 2015-05-20 ENCOUNTER — Encounter: Payer: Self-pay | Admitting: Family Medicine

## 2015-05-20 ENCOUNTER — Ambulatory Visit (INDEPENDENT_AMBULATORY_CARE_PROVIDER_SITE_OTHER): Payer: BC Managed Care – PPO | Admitting: Family Medicine

## 2015-05-20 VITALS — BP 148/89 | HR 76 | Ht 59.75 in | Wt 140.2 lb

## 2015-05-20 DIAGNOSIS — Z1159 Encounter for screening for other viral diseases: Secondary | ICD-10-CM

## 2015-05-20 DIAGNOSIS — E785 Hyperlipidemia, unspecified: Secondary | ICD-10-CM

## 2015-05-20 DIAGNOSIS — M25462 Effusion, left knee: Secondary | ICD-10-CM

## 2015-05-20 DIAGNOSIS — Z23 Encounter for immunization: Secondary | ICD-10-CM | POA: Diagnosis not present

## 2015-05-20 DIAGNOSIS — J301 Allergic rhinitis due to pollen: Secondary | ICD-10-CM

## 2015-05-20 DIAGNOSIS — Z79899 Other long term (current) drug therapy: Secondary | ICD-10-CM | POA: Diagnosis not present

## 2015-05-20 LAB — COMPREHENSIVE METABOLIC PANEL
ALT: 15 U/L (ref 6–29)
AST: 18 U/L (ref 10–35)
Albumin: 4.7 g/dL (ref 3.6–5.1)
Alkaline Phosphatase: 68 U/L (ref 33–130)
BUN: 16 mg/dL (ref 7–25)
CO2: 27 mmol/L (ref 20–31)
Calcium: 9.5 mg/dL (ref 8.6–10.4)
Chloride: 104 mmol/L (ref 98–110)
Creat: 0.54 mg/dL (ref 0.50–1.05)
Glucose, Bld: 94 mg/dL (ref 65–99)
Potassium: 4.8 mmol/L (ref 3.5–5.3)
Sodium: 140 mmol/L (ref 135–146)
Total Bilirubin: 0.6 mg/dL (ref 0.2–1.2)
Total Protein: 7.4 g/dL (ref 6.1–8.1)

## 2015-05-20 LAB — CBC WITH DIFFERENTIAL/PLATELET
Basophils Absolute: 0.1 10*3/uL (ref 0.0–0.1)
Basophils Relative: 1 % (ref 0–1)
Eosinophils Absolute: 0.2 10*3/uL (ref 0.0–0.7)
Eosinophils Relative: 3 % (ref 0–5)
HCT: 41.8 % (ref 36.0–46.0)
Hemoglobin: 13.7 g/dL (ref 12.0–15.0)
Lymphocytes Relative: 37 % (ref 12–46)
Lymphs Abs: 2.2 10*3/uL (ref 0.7–4.0)
MCH: 31.4 pg (ref 26.0–34.0)
MCHC: 32.8 g/dL (ref 30.0–36.0)
MCV: 95.7 fL (ref 78.0–100.0)
MPV: 9.2 fL (ref 8.6–12.4)
Monocytes Absolute: 0.5 10*3/uL (ref 0.1–1.0)
Monocytes Relative: 9 % (ref 3–12)
Neutro Abs: 3 10*3/uL (ref 1.7–7.7)
Neutrophils Relative %: 50 % (ref 43–77)
Platelets: 410 10*3/uL — ABNORMAL HIGH (ref 150–400)
RBC: 4.37 MIL/uL (ref 3.87–5.11)
RDW: 13.5 % (ref 11.5–15.5)
WBC: 6 10*3/uL (ref 4.0–10.5)

## 2015-05-20 LAB — LIPID PANEL
Cholesterol: 175 mg/dL (ref 125–200)
HDL: 80 mg/dL (ref >=46)
LDL Cholesterol: 83 mg/dL (ref <130)
Total CHOL/HDL Ratio: 2.2 Ratio (ref <=5.0)
Triglycerides: 59 mg/dL (ref <150)
VLDL: 12 mg/dL (ref <30)

## 2015-05-20 NOTE — Progress Notes (Signed)
   Subjective:    Patient ID: Desiree Yu, female    DOB: 1960-07-11, 55 y.o.   MRN: [id number]  HPI She is here for an interval evaluation. Her main complaint today is continued difficulty with left knee pain. She is now seeing a movement specialist who thinks that she has some mechanical issues. She does complain of knee swelling when she exercises and does have an occasional pop but no locking, grinding or giving way. She does have a history of hyperlipidemia as well as allergies area she does take a lot of OTC preparations and is happy with them. Presently she is on no allergy medication. Social and family history was reviewed as well as health maintenance and immunizations.  Review of Systems     Objective:   Physical Exam Alert and in no distress. Tympanic membranes and canals are normal. Pharyngeal area is normal. Neck is supple without adenopathy or thyromegaly. Cardiac exam shows a regular sinus rhythm without murmurs or gallops. Lungs are clear to auscultation. Left knee exam does show an effusion. Ligaments are intact. McMurray's testing did cause some discomfort.       Assessment & Plan:  Need for prophylactic vaccination and inoculation against influenza - Plan: Flu Vaccine QUAD 36+ mos IM  Need for shingles vaccine - Plan: Varicella-zoster vaccine subcutaneous  Effusion of knee joint, left - Plan: DG Knee Complete 4 Views Left  Need for hepatitis C screening test - Plan: Hepatitis C antibody  Hyperlipidemia with target LDL less than 100 - Plan: CBC with Differential/Platelet, Comprehensive metabolic panel, Lipid panel  Encounter for long-term (current) use of medications - Plan: CBC with Differential/Platelet, Comprehensive metabolic panel  Non-seasonal allergic rhinitis due to pollen Discussed the treatment of her knee with her as well as the biomechanics. Encouraged her to continue stretching and strengthening exercises. Discussed the possibility of a joint injection  to help with the pain as it does seem that she also has some intrinsic joint damage possibly meniscal in nature. Explained the fact that surgery is not necessarily a good option for this.Over 40 minutes, greater than 50% spent in counseling and coronation of care.

## 2015-05-20 NOTE — Telephone Encounter (Signed)
error 

## 2015-05-21 ENCOUNTER — Ambulatory Visit
Admission: RE | Admit: 2015-05-21 | Discharge: 2015-05-21 | Disposition: A | Discharge status: Home / Self Care | Payer: BC Managed Care – PPO | Source: Ambulatory Visit | Attending: Family Medicine | Admitting: Family Medicine

## 2015-05-21 DIAGNOSIS — M25462 Effusion, left knee: Secondary | ICD-10-CM

## 2015-05-21 LAB — HEPATITIS C ANTIBODY: HCV Ab: NEGATIVE

## 2015-05-28 ENCOUNTER — Ambulatory Visit (INDEPENDENT_AMBULATORY_CARE_PROVIDER_SITE_OTHER): Payer: BC Managed Care – PPO | Admitting: Family Medicine

## 2015-05-28 ENCOUNTER — Encounter: Payer: Self-pay | Admitting: Family Medicine

## 2015-05-28 VITALS — Wt 142.0 lb

## 2015-05-28 DIAGNOSIS — M25462 Effusion, left knee: Secondary | ICD-10-CM | POA: Diagnosis not present

## 2015-05-28 MED ORDER — TRIAMCINOLONE ACETONIDE 40 MG/ML IJ SUSP
40.0000 mg | Freq: Once | INTRAMUSCULAR | Status: AC
  Administered 2015-05-28: 40 mg via INTRAMUSCULAR

## 2015-05-28 MED ORDER — LIDOCAINE HCL (PF) 1 % IJ SOLN
3.0000 mL | Freq: Once | INTRAMUSCULAR | Status: AC
  Administered 2015-05-28: 3 mL via INTRADERMAL

## 2015-05-28 NOTE — Progress Notes (Signed)
   Subjective:    Patient ID: Desiree Yu, female    DOB: 1960/05/13, 55 y.o.   MRN: [id number]  HPI She is here for follow-up visit. She did have a recent x-ray which did show some minimal changes in the medial aspect of her knee. She continues to have pain and difficulty with full flexion. She is concerned that this could be from difficulties with either hip or ankle problems.   Review of Systems     Objective:   Physical Exam Alert and in no distress otherwise not examined       Assessment & Plan:  Effusion of knee joint, left I discussed the knee x-ray with her in detail. Discussed the fact that indeed she could be having some biomechanical issues in terms of her feet or even her hips as well as knee. Recommend an injection to see how much this would help and if further difficulty, refer for further biomechanical evaluation. She was comfortable with this. The knee was prepped with Betadine laterally in the joint line identified. 40 mg of Kenalog and 3 mL of Xylocaine was injected into the joint space without difficulty. She will keep me informed as to how she is doing.

## 2015-07-07 ENCOUNTER — Telehealth: Payer: Self-pay | Admitting: Family Medicine

## 2015-07-07 DIAGNOSIS — Z13820 Encounter for screening for osteoporosis: Secondary | ICD-10-CM

## 2015-07-07 NOTE — Telephone Encounter (Signed)
Pt come by and needs a referral to have a bone density test done pt had [employer] pt can be reached at [phone].

## 2015-07-08 NOTE — Telephone Encounter (Signed)
Set her up for DEXA

## 2015-07-09 NOTE — Telephone Encounter (Signed)
Left VM asking patient to call [facility] [phone]  to schedule DEXA

## 2015-07-09 NOTE — Telephone Encounter (Signed)
Order in and [facility] pt with number in order to set up dexa

## 2015-10-25 ENCOUNTER — Telehealth: Payer: Self-pay | Admitting: Family Medicine

## 2015-10-25 ENCOUNTER — Other Ambulatory Visit: Payer: Self-pay

## 2015-10-25 MED ORDER — ATORVASTATIN CALCIUM 20 MG PO TABS: 20.0000 mg | ORAL_TABLET | Freq: Every day | ORAL | Status: DC

## 2015-10-25 NOTE — Telephone Encounter (Signed)
Rcvd refill request for Atorvastatin 20 mg #30

## 2015-10-25 NOTE — Telephone Encounter (Signed)
Sent in

## 2016-04-05 ENCOUNTER — Ambulatory Visit (INDEPENDENT_AMBULATORY_CARE_PROVIDER_SITE_OTHER): Payer: BC Managed Care – PPO | Admitting: Family Medicine

## 2016-04-05 ENCOUNTER — Encounter: Payer: Self-pay | Admitting: Family Medicine

## 2016-04-05 VITALS — BP 138/90 | HR 78 | Resp 16 | Wt 137.2 lb

## 2016-04-05 DIAGNOSIS — J0110 Acute frontal sinusitis, unspecified: Secondary | ICD-10-CM | POA: Diagnosis not present

## 2016-04-05 DIAGNOSIS — Z23 Encounter for immunization: Secondary | ICD-10-CM | POA: Diagnosis not present

## 2016-04-05 DIAGNOSIS — R030 Elevated blood-pressure reading, without diagnosis of hypertension: Secondary | ICD-10-CM | POA: Diagnosis not present

## 2016-04-05 DIAGNOSIS — J3089 Other allergic rhinitis: Secondary | ICD-10-CM

## 2016-04-05 DIAGNOSIS — Z8371 Family history of colonic polyps: Secondary | ICD-10-CM

## 2016-04-05 DIAGNOSIS — E785 Hyperlipidemia, unspecified: Secondary | ICD-10-CM | POA: Diagnosis not present

## 2016-04-05 DIAGNOSIS — Z8669 Personal history of other diseases of the nervous system and sense organs: Secondary | ICD-10-CM | POA: Diagnosis not present

## 2016-04-05 LAB — COMPREHENSIVE METABOLIC PANEL
ALT: 25 U/L (ref 6–29)
AST: 25 U/L (ref 10–35)
Albumin: 4.8 g/dL (ref 3.6–5.1)
Alkaline Phosphatase: 64 U/L (ref 33–130)
BUN: 15 mg/dL (ref 7–25)
CO2: 26 mmol/L (ref 20–31)
Calcium: 9.8 mg/dL (ref 8.6–10.4)
Chloride: 103 mmol/L (ref 98–110)
Creat: 0.64 mg/dL (ref 0.50–1.05)
Glucose, Bld: 99 mg/dL (ref 65–99)
Potassium: 4 mmol/L (ref 3.5–5.3)
Sodium: 137 mmol/L (ref 135–146)
Total Bilirubin: 0.9 mg/dL (ref 0.2–1.2)
Total Protein: 7.5 g/dL (ref 6.1–8.1)

## 2016-04-05 LAB — CBC WITH DIFFERENTIAL/PLATELET
Basophils Absolute: 72 cells/uL (ref 0–200)
Basophils Relative: 1 %
Eosinophils Absolute: 72 cells/uL (ref 15–500)
Eosinophils Relative: 1 %
HCT: 40.3 % (ref 35.0–45.0)
Hemoglobin: 13.7 g/dL (ref 11.7–15.5)
Lymphocytes Relative: 38 %
Lymphs Abs: 2736 cells/uL (ref 850–3900)
MCH: 31.6 pg (ref 27.0–33.0)
MCHC: 34 g/dL (ref 32.0–36.0)
MCV: 93.1 fL (ref 80.0–100.0)
MPV: 9.4 fL (ref 7.5–12.5)
Monocytes Absolute: 576 cells/uL (ref 200–950)
Monocytes Relative: 8 %
Neutro Abs: 3744 cells/uL (ref 1500–7800)
Neutrophils Relative %: 52 %
Platelets: 391 10*3/uL (ref 140–400)
RBC: 4.33 MIL/uL (ref 3.80–5.10)
RDW: 13.6 % (ref 11.0–15.0)
WBC: 7.2 10*3/uL (ref 4.0–10.5)

## 2016-04-05 LAB — LIPID PANEL
Cholesterol: 165 mg/dL (ref <200)
HDL: 86 mg/dL (ref >50)
LDL Cholesterol: 67 mg/dL (ref <100)
Total CHOL/HDL Ratio: 1.9 Ratio (ref <5.0)
Triglycerides: 60 mg/dL (ref <150)
VLDL: 12 mg/dL (ref <30)

## 2016-04-05 MED ORDER — ATORVASTATIN CALCIUM 20 MG PO TABS: 20.0000 mg | ORAL_TABLET | Freq: Every day | ORAL | 3 refills | Status: DC

## 2016-04-05 MED ORDER — AMOXICILLIN-POT CLAVULANATE 875-125 MG PO TABS: 1.0000 | ORAL_TABLET | Freq: Two times a day (BID) | ORAL | 0 refills | Status: DC

## 2016-04-05 NOTE — Progress Notes (Signed)
   Subjective:    Patient ID: Desiree Yu, female    DOB: [birth year], 55 y.o.   MRN: [id number]  HPI She is here for medication check. She is also having difficulty over the last 4 weeks with frontal sinus pressure and also seeing yellowish and slightly bloody drainage from her nose some PND. No fever, chills, upper tooth discomfort, sore throat or earache. Recently she did have what sounds like a retinal detachment and has seen ophthalmology. She does need follow-up as she is also having some slight haziness in her vision. She also has concerns about her blood pressure. She does see gynecology and has had a mammogram as well as recent Pap. There is also family history of colonic polyps that she thinks were precancerous lesions. She has had a colonoscopy within last several years. He continues on Lipitor for her treatment of she does have underlying allergies and seems to have them under good control.   Review of Systems     Objective:   Physical Exam Alert and in no distress. Tympanic membranes and canals are normal. Pharyngeal area is normal. Neck is supple without adenopathy or thyromegaly. Cardiac exam shows a regular sinus rhythm without murmurs or gallops. Lungs are clear to auscultation. His mucosa is red with tenderness especially over frontal sinuses.        Assessment & Plan:  Elevated blood pressure reading - Plan: CBC with Differential/Platelet, Comprehensive metabolic panel  Need for prophylactic vaccination and inoculation against influenza - Plan: Flu Vaccine QUAD 36+ mos IM  History of retinal detachment  Hyperlipidemia with target LDL less than 100 - Plan: atorvastatin (LIPITOR) 20 MG tablet, Lipid panel  Chronic nonseasonal allergic rhinitis due to pollen  Family history of colonic polyps - Plan: CBC with Differential/Platelet, Comprehensive metabolic panel  Acute non-recurrent frontal sinusitis - Plan: amoxicillin-clavulanate (AUGMENTIN) 875-125 MG tablet I'll  give her Augmentin and have her call if no improvement. Also discussed her blood pressure and with today's new criteria, she would qualify as having hypertension however I will label her as having elevated blood pressure. Discussed diet and exercise as well as salt restriction. She would like to try this before being placed on the medication. She is to return here in 2 months for this. She will follow-up with ophthalmology concerning her eye. Lipitor was renewed. Flu shot given today.

## 2016-06-06 ENCOUNTER — Ambulatory Visit: Payer: BC Managed Care – PPO | Admitting: Family Medicine

## 2016-06-09 ENCOUNTER — Ambulatory Visit (INDEPENDENT_AMBULATORY_CARE_PROVIDER_SITE_OTHER): Payer: BC Managed Care – PPO | Admitting: Family Medicine

## 2016-06-09 ENCOUNTER — Encounter: Payer: Self-pay | Admitting: Family Medicine

## 2016-06-09 VITALS — BP 122/80 | HR 80 | Resp 16 | Wt 139.2 lb

## 2016-06-09 DIAGNOSIS — R030 Elevated blood-pressure reading, without diagnosis of hypertension: Secondary | ICD-10-CM

## 2016-06-09 NOTE — Progress Notes (Signed)
   Subjective:    Patient ID: Desiree Yu, female    DOB: [birth year], 58 y.o.   MRN: [id number]  HPI She is here for recheck. She has started exercising and been checking her blood pressure at home. The numbers have been quite good.   Review of Systems     Objective:   Physical Exam Alert and in no distress. Blood pressure is recorded.       Assessment & Plan:  Elevated blood pressure reading  I complemented her on the good results with her exercise. Encouraged her to continue with this. Cautioned that in spite of her good work, eventually she could need to be placed on medication. Recommend we continue to monitor this. She will do this at home. Apparently the cuff at home is accurate.

## 2016-09-07 ENCOUNTER — Telehealth: Payer: Self-pay | Admitting: Family Medicine

## 2016-09-07 ENCOUNTER — Other Ambulatory Visit: Payer: Self-pay

## 2016-09-07 MED ORDER — ALPRAZOLAM 0.25 MG PO TABS: 0.2500 mg | ORAL_TABLET | ORAL | 0 refills | Status: DC | PRN

## 2016-09-07 MED ORDER — ZOLPIDEM TARTRATE 10 MG PO TABS: 10.0000 mg | ORAL_TABLET | Freq: Every evening | ORAL | 0 refills | Status: DC | PRN

## 2016-09-07 NOTE — Telephone Encounter (Signed)
Both meds called in. 

## 2016-09-07 NOTE — Telephone Encounter (Signed)
Okay 

## 2016-09-07 NOTE — Telephone Encounter (Signed)
Pt going out of the country and you usually will Rx her sleeping medicine to regulate her sleeping while she travels generic Ambien [address]

## 2016-11-23 ENCOUNTER — Ambulatory Visit (INDEPENDENT_AMBULATORY_CARE_PROVIDER_SITE_OTHER): Payer: BC Managed Care – PPO | Admitting: Family Medicine

## 2016-11-23 ENCOUNTER — Encounter: Payer: Self-pay | Admitting: Family Medicine

## 2016-11-23 VITALS — BP 120/80 | HR 88 | Temp 98.1°F | Wt 136.0 lb

## 2016-11-23 DIAGNOSIS — J0100 Acute maxillary sinusitis, unspecified: Secondary | ICD-10-CM

## 2016-11-23 DIAGNOSIS — J301 Allergic rhinitis due to pollen: Secondary | ICD-10-CM

## 2016-11-23 MED ORDER — AMOXICILLIN-POT CLAVULANATE 875-125 MG PO TABS: 1.0000 | ORAL_TABLET | Freq: Two times a day (BID) | ORAL | 0 refills | Status: DC

## 2016-11-23 NOTE — Progress Notes (Signed)
   Subjective:    Patient ID: Desiree Yu, female    DOB: [birth year], 56 y.o.   MRN: [id number]  HPI  she complains of a two-month history that started on a recent trip to [place] and difficulty wth sinus infection while she was there. Since returning she has continued to have left-sided nasal and maxillary pressure as well as ear popping on that side with occasional PND, sneezing, itchy watery eyes While she was in [place] she did have upper tooth discomfort. Se contnues on Flonase and has us nasal saline as well as Claritin-D.  Review of Systems     Objective:   Physical Exam Alert and in no distress. Tympanic membranes and canals are normal. Pharyngeal area is normal. Neck is supple without adenopathy or thyromegaly. Cardiac exam shows a regular sinus rhythm without murmurs or gallops. Lungs are clear to auscultation.  nasal mucosa is slightly red.  some tenderness over maxillary sinuses especially on the left.       Assessment & Plan:  Acute maxillary sinusitis, recurrence not specified - Plan: amoxicillin-clavulanate (AUGMENTIN) 875-125 MG tablet  Non-seasonal allergic rhinitis due to pollen  I will place her on Augmenntin. If she continues to improve, no further intervention. May consider adding a steroid taper and possibly doing a CT scanning in th future.

## 2017-03-28 LAB — HM MAMMOGRAPHY

## 2017-04-02 ENCOUNTER — Ambulatory Visit: Payer: BC Managed Care – PPO | Admitting: Family Medicine

## 2017-04-02 ENCOUNTER — Encounter: Payer: Self-pay | Admitting: Family Medicine

## 2017-04-02 VITALS — BP 142/86 | HR 84 | Temp 98.5°F | Ht 59.0 in | Wt 139.0 lb

## 2017-04-02 DIAGNOSIS — J0140 Acute pansinusitis, unspecified: Secondary | ICD-10-CM | POA: Diagnosis not present

## 2017-04-02 MED ORDER — AMOXICILLIN-POT CLAVULANATE 875-125 MG PO TABS: 1.0000 | ORAL_TABLET | Freq: Two times a day (BID) | ORAL | 0 refills | Status: DC

## 2017-04-02 NOTE — Patient Instructions (Signed)
Complete the antibiotic and eat yogurt or take a probiotic while on the antibiotic as discussed. Stay well hydrated.  Continue with saline nasal flushes and you may take a decongestant.  If you are not 100% improved then let us know.  Dr. Susann GivensLalonde may want to send you for a CT of your sinuses at that point.

## 2017-04-02 NOTE — Progress Notes (Signed)
Chief Complaint  Patient presents with  . Facial Pain    sinus pain and pressure mainly on the left side. Feels like her head is going to explode. Mucus is yellow and blood streaked. Using sinus rinses. Symptoms started Saturday. No fevers that she knows of but hasn't taken temp.     Subjective:  Desiree Yu is a 56 y.o. female who presents for possible sinus infection.  Symptoms started 2-3 days ago with an acute onset of nasal congestion, thick purulent drainage, facial pain on left side mainly and upper tooth pain.  Denies fever, chills, sore throat, cough, chest pain, shortness of breath, N/V/D.    Past history is significant for no history of pneumonia or bronchitis. Patient is a non-smoker.  Using saline nasal rinses for symptoms. Occasionally takes Claritin D.  Denies sick contacts.  No other aggravating or relieving factors.  No other c/o.  Sinusitis diagnosis in 11/2016 but symptoms resolved completely. States this is her second sinus infection this year. Questions whether she needs an ENT referral or CT of sinuses. States her sister recently had to have sinus surgery.  States she saw an ENT approximately 20 years ago for recurrent sinus infections.   ROS as in subjective   Objective: Vitals:   04/02/17 0957  BP: (!) 142/86  Pulse: 84  Temp: 98.5 F (36.9 C)    General appearance: Alert, WD/WN, no distress                             Skin: warm, no rash                           Head: + frontal and maxillary sinus tenderness, L>R                             Eyes: conjunctiva normal, corneas clear, PERRLA                            Ears: pearly TMs, external ear canals normal                          Nose: septum deviated, turbinates swollen, with erythema and  Thick discharge             Mouth/throat: MMM, tongue normal, mild pharyngeal erythema                           Neck: supple, no adenopathy, no thyromegaly, nontender                          Heart: RRR, normal S1,  S2, no murmurs                         Lungs: CTA bilaterally, no wheezes, rales, or rhonchi       Assessment and Plan: Acute non-recurrent pansinusitis - Plan: amoxicillin-clavulanate (AUGMENTIN) 875-125 MG tablet  Prescription sent for Augmentin. Can use Claritin D for congestion and drainage, this has helped in the past.  Tylenol or Ibuprofen OTC for fever and malaise.  Discussed symptomatic relief, nasal saline flush, and call or return if not back to baseline after completing the antibiotic. Offered CT sinuses  or referral to ENT since she requested this or the option of treating her current symptoms and if she is not back to baseline or has another infection in the next few months she will follow up. She chooses to treat her current infection and follow up if needed.

## 2017-04-03 ENCOUNTER — Encounter: Payer: Self-pay | Admitting: Family Medicine

## 2017-04-17 ENCOUNTER — Other Ambulatory Visit: Payer: Self-pay | Admitting: Family Medicine

## 2017-04-17 DIAGNOSIS — E785 Hyperlipidemia, unspecified: Secondary | ICD-10-CM

## 2017-08-01 ENCOUNTER — Ambulatory Visit: Payer: BC Managed Care – PPO | Admitting: Medical

## 2017-08-01 ENCOUNTER — Encounter: Payer: Self-pay | Admitting: Medical

## 2017-08-01 VITALS — BP 178/108 | HR 90 | Temp 98.2°F

## 2017-08-01 DIAGNOSIS — R519 Headache, unspecified: Secondary | ICD-10-CM

## 2017-08-01 DIAGNOSIS — R6883 Chills (without fever): Secondary | ICD-10-CM | POA: Diagnosis not present

## 2017-08-01 DIAGNOSIS — R51 Headache: Secondary | ICD-10-CM

## 2017-08-01 DIAGNOSIS — R52 Pain, unspecified: Secondary | ICD-10-CM | POA: Diagnosis not present

## 2017-08-01 DIAGNOSIS — R112 Nausea with vomiting, unspecified: Secondary | ICD-10-CM

## 2017-08-01 DIAGNOSIS — R448 Other symptoms and signs involving general sensations and perceptions: Secondary | ICD-10-CM

## 2017-08-01 DIAGNOSIS — R6889 Other general symptoms and signs: Secondary | ICD-10-CM | POA: Diagnosis not present

## 2017-08-01 MED ORDER — ONDANSETRON HCL 4 MG PO TABS: 4.0000 mg | ORAL_TABLET | Freq: Three times a day (TID) | ORAL | 0 refills | Status: DC | PRN

## 2017-08-01 MED ORDER — HYDROCODONE-ACETAMINOPHEN 5-325 MG PO TABS: 1.0000 | ORAL_TABLET | Freq: Four times a day (QID) | ORAL | 0 refills | Status: DC | PRN

## 2017-08-01 MED ORDER — OSELTAMIVIR PHOSPHATE 75 MG PO CAPS: 75.0000 mg | ORAL_CAPSULE | Freq: Two times a day (BID) | ORAL | 0 refills | Status: DC

## 2017-08-01 NOTE — Progress Notes (Signed)
Subjective: Chief Complaint  Patient presents with  . other    started sunday with what seemed like allergyies, left  side of face felling of pressure. Nausea  little congestion aching all over.    Here with husband.  History provided by her and husband.  Early this week felt some congestion, though it was allergies, started Flonase.  didn't feel bad until today.   Today she reports acute onset of body aches, chills, head pounding, left facial /sinus pressure, nausea, left maxillary area of face feels swollen in face and mouth.  She does have history of sinus infections but usually doesn't have body aches and this severity of symptoms with sinus infection.  This feels like a cross between a sinus infection and the flu.  Denies loose stools.  +decreased appetite.  No ear pain, no sore throat.  Has some cough.   Sick contacts: Husband had cold recently.  No recent travel.   No numbness, no tingling, no weakness, no CP , no SOB. No urinary issues, no rash.  Used some OTC cold medication this morning.  Usually does not have issues with elevated BP, but feels awful today,not eating or drinking much today, has bad headache.       Past Medical History:  Diagnosis Date  . Allergy    RHINITIS  . Hemorrhoids   . Hx of colonic polyps   . IBS (irritable bowel syndrome)    Current Outpatient Medications on File Prior to Visit  Medication Sig Dispense Refill  . ALPRAZolam (XANAX) 0.25 MG tablet Take 1 tablet (0.25 mg total) by mouth as needed for anxiety. 20 tablet 0  . atorvastatin (LIPITOR) 20 MG tablet take 1 tablet by mouth once daily 90 tablet 3  . COLLAGEN PO Take 6 tablets by mouth daily.    . diphenhydrAMINE-PSE-APAP (COLD CONTROL + PO) Take 2 tablets by mouth.    . fish oil-omega-3 fatty acids 1000 MG capsule Take 2 g by mouth daily.      . Multiple Vitamins-Minerals (MULTIVITAMIN WITH MINERALS) tablet Take 1 tablet by mouth daily.      . TURMERIC CURCUMIN PO Take by mouth.    Marland Kitchen  amoxicillin-clavulanate (AUGMENTIN) 875-125 MG tablet Take 1 tablet by mouth 2 (two) times daily. (Patient not taking: Reported on 08/01/2017) 20 tablet 0  . zolpidem (AMBIEN) 10 MG tablet Take 1 tablet (10 mg total) by mouth at bedtime as needed for sleep. (Patient not taking: Reported on 04/02/2017) 15 tablet 0   No current facility-administered medications on file prior to visit.    ROS as in subjective   Objective: BP (!) 178/108 (BP Location: Left Arm, Patient Position: Lying right side)   Pulse 90   Temp 98.2 F (36.8 C)   SpO2 96%   Wt Readings from Last 3 Encounters:  04/02/17 139 lb (63 kg)  11/23/16 136 lb (61.7 kg)  06/09/16 139 lb 3.2 oz (63.1 kg)   BP Readings from Last 3 Encounters:  08/01/17 (!) 178/108  04/02/17 (!) 142/86  11/23/16 120/80   General: Ill-appearing, well-developed, well-nourished, lying on exam table Skin: warm, dry HEENT: Nose inflamed and congested, clear conjunctiva, TMs pearly, no sinus tenderness, pharynx with erythema, no exudates Neck: Supple, non tender, shotty cervical adenopathy Heart: Regular rate and rhythm, normal S1, S2, no murmurs Lungs: Clear to auscultation bilaterally, no wheezes, rales, rhonchi Abdomen: Non tender non distended Extremities: Mild generalized tenderness Neuro: A&Ox 3,  CN2-12 intact, non focal exam  Assessment: Encounter Diagnoses  Name Primary?  . Body aches Yes  . Chills   . Flu-like symptoms   . Acute intractable headache, unspecified headache type   . Facial pressure   . Nausea and vomiting, intractability of vomiting not specified, unspecified vomiting type      Plan: Flu test negative.   It was 5:15pm before I had a chance to evaluate her.  By this time, the phlebotomist had already left for the day.  Discussed her symptoms, exam findings, and I suspect influenza.   We gave a round of promethazine 25mg  IM given her nausea, and she had vomited while here earlier.   Discussed differential, but  give her exam, acute onset, and current symptoms, flu most likely.     Prescription given for Tamiflu, discussed risks/benefits of medication.    Discussed diagnosis of influenza. Discussed supportive care including rest, hydration, OTC Tylenol or NSAID for fever, aches, and malaise.  Discussed period of contagion, self quarantine at home away from others to avoid spread of disease, discussed means of transmission, and possible complications including pneumonia.    Patient voiced understanding of diagnosis, recommendations, and treatment plan.  Advised she not use any OTC decongestants, any medication with "D" such as sudafed or mucinex D.   I suspect her BP is elevated today due to combination of illness, OTC cold medication, and mild dehydration.    advised if worse or not seeing any improvement in 48 hours to call or return.   She can return in the morning for STAT BMET and CBC if not much improved overnight.  She and husband voiced understanding and agreement of plan.  Larita FifeLynn was seen today for other.  Diagnoses and all orders for this visit:  Body aches  Chills  Flu-like symptoms  Acute intractable headache, unspecified headache type  Facial pressure  Nausea and vomiting, intractability of vomiting not specified, unspecified vomiting type  Other orders -     oseltamivir (TAMIFLU) 75 MG capsule; Take 1 capsule (75 mg total) by mouth 2 (two) times daily. -     ondansetron (ZOFRAN) 4 MG tablet; Take 1 tablet (4 mg total) by mouth every 8 (eight) hours as needed for nausea or vomiting. -     HYDROcodone-acetaminophen (NORCO) 5-325 MG tablet; Take 1 tablet by mouth every 6 (six) hours as needed for moderate pain.

## 2017-08-01 NOTE — Patient Instructions (Signed)
Recommendations:  Your symptoms and exam suggests flu like syndrome  Begin Tamiflu flu medication 1 capsule twice daily for 5 days  You can use Hydrocodone pain medication, 1 tablet every 4-6 hours as needed short term  Hopefully you will only need this today or tomorrow  If less pain, you can use OTC tylenol or ibuprofen  You can use Zofran for nausea, every 4-6 hours  Your main job is to rest and stay hydrated  You can use some Mucinex for congestion  AVOID any sinus decongestants such as sudafed, Phenylphrine or anting on the label for sinus congestion     I have included other useful information below for your review.   Influenza, Adult Influenza ("the flu") is a viral infection of the respiratory tract. It causes chills, fever, cough, headache, body aches, and sore throat. Influenza in general will make you feel sicker than when you have a common cold. Symptoms of the illness typically last a few days. Cough and fatigue may continue for as long as 7 to 10 days. Influenza is highly contagious. It spreads easily to others in the droplets from coughs and sneezes. People frequently become infected by touching something that was recently contaminated with the virus and then touch their mouth, nose or eyes. This infection is caused by a virus. Symptoms will not be reduced or improved by taking an antibiotic. Antibiotics are medications that kill bacteria, not viruses. DIAGNOSIS  Diagnosis of influenza is often made based on the history and physical examination as well as the presence of influenza reports occurring in your community. Testing can be done if the diagnosis is not certain. TREATMENT  Since influenza is caused by a virus, antibiotics are not helpful. Your caregiver may prescribe antiviral medicines to shorten the illness and lessen the severity. Your caregiver may also recommend influenza vaccination and/or antiviral medicines for your family members in order to prevent the  spread of influenza to them. HOME CARE INSTRUCTIONS  DO NOT GIVE ASPIRIN TO PERSONS WITH INFLUENZA WHO ARE UNDER 59 YEARS OF AGE. This could lead to brain and liver damage (Reye's syndrome). Read the label on over-the-counter medicines.   Stay home from work or school if at all possible until most of your symptoms are gone.   Only take over-the-counter or prescription medicines for pain, discomfort, or fever as directed by your caregiver.   Use a cool mist humidifier to increase air moisture. This will make breathing easier.   Rest until your temperature is nearly normal: 98.6 F (37 C). This usually takes 3 to 4 days. Be sure you get plenty of rest.   Drink at least eight, eight-ounce glasses of fluids per day. Fluids include water, juice, broth, gelatin, or lemonade.   Cover your mouth and nose when coughing or sneezing and wash your hands often to prevent the spread of this virus to other persons.  PREVENTION  Annual influenza vaccination (flu shots) is the best way to avoid getting influenza. An annual flu shot is now routinely recommended for all adults in the [place] SEEK MEDICAL CARE IF:   You develop shortness of breath while resting.   You have a deep cough with production of mucous or chest pain.   You develop nausea (feeling sick to your stomach), vomiting, or diarrhea.  SEEK IMMEDIATE MEDICAL CARE IF:   You have difficulty breathing, become short of breath, or your skin or nails turn bluish.   You develop severe neck pain or stiffness.   You  develop a severe headache, facial pain, or earache.   You have a fever.   You develop nausea or vomiting that cannot be controlled.  Document Released: 04/21/2000 Document Revised: 01/04/2011 Document Reviewed: 02/24/2009 [facility] Patient Information 2012 [place], [place].

## 2017-08-02 ENCOUNTER — Other Ambulatory Visit: Payer: Self-pay

## 2017-08-02 ENCOUNTER — Telehealth: Payer: Self-pay | Admitting: Family Medicine

## 2017-08-02 DIAGNOSIS — R112 Nausea with vomiting, unspecified: Secondary | ICD-10-CM | POA: Diagnosis not present

## 2017-08-02 MED ORDER — PROMETHAZINE HCL 25 MG PO TABS: 25.0000 mg | ORAL_TABLET | Freq: Once | ORAL | Status: DC

## 2017-08-02 MED ORDER — PROMETHAZINE HCL 50 MG/ML IJ SOLN
25.0000 mg | Freq: Once | INTRAMUSCULAR | Status: AC
  Administered 2017-08-02: 25 mg via INTRAMUSCULAR

## 2017-08-02 NOTE — Telephone Encounter (Signed)
Called and informed pt and she would call back tomorrow and let us know how she was doing

## 2017-08-02 NOTE — Addendum Note (Signed)
Addended by: Renelda LomaHENRY, Muriah Harsha on: 08/02/2017 11:10 AM   Modules accepted: Orders

## 2017-08-02 NOTE — Telephone Encounter (Signed)
If that much improvement we can see how she does today and tomorrow morning.  Glad to hear there is improvement.

## 2017-08-02 NOTE — Telephone Encounter (Signed)
Pts husband called and states that pt feels about 60 percent better, and her blood pressure is 102/74 and she has been taking all the medicines that was prescribed to her, they are wanting to know if she still needs to come in for the lab work, they can be reached at [phone]

## 2017-09-17 ENCOUNTER — Telehealth: Payer: Self-pay | Admitting: Family Medicine

## 2017-09-17 MED ORDER — ALPRAZOLAM 0.25 MG PO TABS: 0.2500 mg | ORAL_TABLET | ORAL | 0 refills | Status: DC | PRN

## 2017-09-17 NOTE — Telephone Encounter (Signed)
Pt called and is requesting a refill on her xanax pt uses [id number] [place], [place] - [address] AT [facility] [address] pt can be reached at [phone]

## 2018-01-15 ENCOUNTER — Telehealth: Payer: Self-pay | Admitting: Family Medicine

## 2018-01-15 DIAGNOSIS — J0140 Acute pansinusitis, unspecified: Secondary | ICD-10-CM

## 2018-01-15 NOTE — Telephone Encounter (Signed)
Pt left message that she is going out of the country for a month and usually takes prescriptions with her for going out of the country & would like to do that again.

## 2018-01-16 MED ORDER — ZOLPIDEM TARTRATE 10 MG PO TABS: 10.0000 mg | ORAL_TABLET | Freq: Every evening | ORAL | 0 refills | Status: DC | PRN

## 2018-01-16 MED ORDER — ALPRAZOLAM 0.25 MG PO TABS: 0.2500 mg | ORAL_TABLET | ORAL | 0 refills | Status: DC | PRN

## 2018-01-16 MED ORDER — AMOXICILLIN-POT CLAVULANATE 875-125 MG PO TABS
1.0000 | ORAL_TABLET | Freq: Two times a day (BID) | ORAL | 0 refills | Status: DC
Start: 2018-01-16 — End: 2018-02-05

## 2018-01-16 NOTE — Telephone Encounter (Signed)
Called pt and she is going to [place] for a month, she would like refills on Ambien, Xanax and Augmentin for sinus infection, said always gets one when flying to [employer].

## 2018-01-16 NOTE — Telephone Encounter (Signed)
I need to know what medication she is talking about.  It depends on where she is going as to what I would give her

## 2018-02-05 ENCOUNTER — Telehealth: Payer: Self-pay | Admitting: Family Medicine

## 2018-02-05 DIAGNOSIS — J0140 Acute pansinusitis, unspecified: Secondary | ICD-10-CM

## 2018-02-05 MED ORDER — AMOXICILLIN-POT CLAVULANATE 875-125 MG PO TABS: 1.0000 | ORAL_TABLET | Freq: Two times a day (BID) | ORAL | 0 refills | Status: DC

## 2018-02-05 NOTE — Telephone Encounter (Signed)
Called pt and lvm that a med was calld in for her

## 2018-02-05 NOTE — Telephone Encounter (Signed)
Let her know that I called it in 

## 2018-02-05 NOTE — Telephone Encounter (Signed)
Pt states that she got a sinus infection and had to go ahead and take the amoxicillin that you had called in for her, she had pain & pressure & yellow drainage with blood.  She would like a refill called in for her to take on her trip.  She states she can come in for appt if you need her to.

## 2018-04-17 ENCOUNTER — Other Ambulatory Visit: Payer: Self-pay | Admitting: Family Medicine

## 2018-04-17 DIAGNOSIS — E785 Hyperlipidemia, unspecified: Secondary | ICD-10-CM

## 2018-05-03 LAB — HM MAMMOGRAPHY

## 2018-05-06 ENCOUNTER — Ambulatory Visit: Payer: BC Managed Care – PPO | Admitting: Family Medicine

## 2018-05-06 ENCOUNTER — Encounter: Payer: Self-pay | Admitting: Family Medicine

## 2018-05-06 VITALS — BP 118/78 | HR 70 | Temp 97.9°F | Wt 134.2 lb

## 2018-05-06 DIAGNOSIS — G479 Sleep disorder, unspecified: Secondary | ICD-10-CM

## 2018-05-06 DIAGNOSIS — Z8371 Family history of colonic polyps: Secondary | ICD-10-CM | POA: Diagnosis not present

## 2018-05-06 DIAGNOSIS — E785 Hyperlipidemia, unspecified: Secondary | ICD-10-CM | POA: Diagnosis not present

## 2018-05-06 DIAGNOSIS — N029 Recurrent and persistent hematuria with unspecified morphologic changes: Secondary | ICD-10-CM

## 2018-05-06 DIAGNOSIS — F418 Other specified anxiety disorders: Secondary | ICD-10-CM

## 2018-05-06 DIAGNOSIS — J301 Allergic rhinitis due to pollen: Secondary | ICD-10-CM

## 2018-05-06 LAB — COMPREHENSIVE METABOLIC PANEL
ALT: 23 IU/L (ref 0–32)
AST: 25 IU/L (ref 0–40)
Albumin/Globulin Ratio: 2 (ref 1.2–2.2)
Albumin: 4.8 g/dL (ref 3.5–5.5)
Alkaline Phosphatase: 64 IU/L (ref 39–117)
BUN/Creatinine Ratio: 24 — ABNORMAL HIGH (ref 9–23)
BUN: 15 mg/dL (ref 6–24)
Bilirubin Total: 0.7 mg/dL (ref 0.0–1.2)
CO2: 22 mmol/L (ref 20–29)
Calcium: 10 mg/dL (ref 8.7–10.2)
Chloride: 102 mmol/L (ref 96–106)
Creatinine, Ser: 0.62 mg/dL (ref 0.57–1.00)
GFR calc Af Amer: 114 mL/min/{1.73_m2} (ref >59)
GFR calc non Af Amer: 99 mL/min/{1.73_m2} (ref >59)
Globulin, Total: 2.4 g/dL (ref 1.5–4.5)
Glucose: 95 mg/dL (ref 65–99)
Potassium: 4.7 mmol/L (ref 3.5–5.2)
Sodium: 139 mmol/L (ref 134–144)
Total Protein: 7.2 g/dL (ref 6.0–8.5)

## 2018-05-06 LAB — CBC WITH DIFFERENTIAL/PLATELET
Basophils Absolute: 0.1 10*3/uL (ref 0.0–0.2)
Basos: 1 %
EOS (ABSOLUTE): 0.2 10*3/uL (ref 0.0–0.4)
Eos: 3 %
Hematocrit: 39.4 % (ref 34.0–46.6)
Hemoglobin: 13.9 g/dL (ref 11.1–15.9)
Immature Grans (Abs): 0 10*3/uL (ref 0.0–0.1)
Immature Granulocytes: 0 %
Lymphocytes Absolute: 2.2 10*3/uL (ref 0.7–3.1)
Lymphs: 34 %
MCH: 32.6 pg (ref 26.6–33.0)
MCHC: 35.3 g/dL (ref 31.5–35.7)
MCV: 93 fL (ref 79–97)
Monocytes Absolute: 0.6 10*3/uL (ref 0.1–0.9)
Monocytes: 9 %
Neutrophils Absolute: 3.4 10*3/uL (ref 1.4–7.0)
Neutrophils: 53 %
Platelets: 386 10*3/uL (ref 150–450)
RBC: 4.26 x10E6/uL (ref 3.77–5.28)
RDW: 12.9 % (ref 12.3–15.4)
WBC: 6.5 10*3/uL (ref 3.4–10.8)

## 2018-05-06 LAB — LIPID PANEL
Chol/HDL Ratio: 2.1 ratio (ref 0.0–4.4)
Cholesterol, Total: 176 mg/dL (ref 100–199)
HDL: 84 mg/dL (ref >39)
LDL Calculated: 78 mg/dL (ref 0–99)
Triglycerides: 69 mg/dL (ref 0–149)
VLDL Cholesterol Cal: 14 mg/dL (ref 5–40)

## 2018-05-06 MED ORDER — ZOLPIDEM TARTRATE 10 MG PO TABS: 10.0000 mg | ORAL_TABLET | Freq: Every evening | ORAL | 1 refills | Status: DC | PRN

## 2018-05-06 MED ORDER — ATORVASTATIN CALCIUM 20 MG PO TABS: 20.0000 mg | ORAL_TABLET | Freq: Every day | ORAL | 3 refills | Status: DC

## 2018-05-06 MED ORDER — ALPRAZOLAM 0.25 MG PO TABS: 0.2500 mg | ORAL_TABLET | ORAL | 0 refills | Status: DC | PRN

## 2018-05-06 NOTE — Progress Notes (Signed)
   Subjective:    Patient ID: Desiree Yu, female    DOB: 1960/08/14, 57 y.o.   MRN: [id number]  HPI She is here for an interval evaluation.  She has noted difficulty over the last several years with increasing trouble staying asleep.  She does note that using a sleep med on occasion does help.  Psychologically things are going well except with dealing with her mother who is now 59 years old.  This sometimes does especially interfere with her dealing with her mother and cause some slight anxiety.  In general she seems to be handling this quite well.  She has a previous history of blood in your urine and has been seen by urology for that.  Apparently it is been a normal evaluation.  She is up-to-date on her health maintenance having recently had a mammogram and Pap smear.  She does use atorvastatin and is having no difficulty with that.  Smoking and drinking was reviewed.   Review of Systems     Objective:   Physical Exam Alert and in no distress. Tympanic membranes and canals are normal. Pharyngeal area is normal. Neck is supple without adenopathy or thyromegaly. Cardiac exam shows a regular sinus rhythm without murmurs or gallops. Lungs are clear to auscultation.       Assessment & Plan:  Sleep disturbance - Plan: zolpidem (AMBIEN) 10 MG tablet  Hyperlipidemia with target LDL less than 100 - Plan: atorvastatin (LIPITOR) 20 MG tablet, Lipid panel  Family history of colonic polyps - Plan: CBC with Differential/Platelet, Comprehensive metabolic panel  Non-seasonal allergic rhinitis due to pollen - Plan: CBC with Differential/Platelet, Comprehensive metabolic panel  Situational anxiety - Plan: ALPRAZolam (XANAX) 0.25 MG tablet I discussed the occasional use of Ambien to help with sleep.  We will also give information concerning that to her.  I then discussed the anxiety she is under with her mother.  She seems to be handling this well.  She will continue on her Lipitor. I then discussed  continuing with her diet and exercise regimen.

## 2018-05-06 NOTE — Patient Instructions (Signed)

## 2018-05-09 LAB — HM PAP SMEAR

## 2018-05-15 ENCOUNTER — Other Ambulatory Visit: Payer: Self-pay | Admitting: Family Medicine

## 2018-05-15 DIAGNOSIS — E785 Hyperlipidemia, unspecified: Secondary | ICD-10-CM

## 2018-05-21 ENCOUNTER — Telehealth: Payer: Self-pay | Admitting: Family Medicine

## 2018-05-21 NOTE — Telephone Encounter (Signed)
Received requested records form [facility]. They sent pap and mammogram. Sending back for review.

## 2019-02-20 ENCOUNTER — Encounter: Payer: Self-pay | Admitting: Family Medicine

## 2019-02-20 ENCOUNTER — Other Ambulatory Visit: Payer: Self-pay

## 2019-02-20 ENCOUNTER — Ambulatory Visit (INDEPENDENT_AMBULATORY_CARE_PROVIDER_SITE_OTHER): Payer: BC Managed Care – PPO | Admitting: Family Medicine

## 2019-02-20 VITALS — BP 126/82 | HR 76 | Temp 97.0°F | Wt 138.2 lb

## 2019-02-20 DIAGNOSIS — S76112A Strain of left quadriceps muscle, fascia and tendon, initial encounter: Secondary | ICD-10-CM

## 2019-02-20 DIAGNOSIS — M25462 Effusion, left knee: Secondary | ICD-10-CM | POA: Diagnosis not present

## 2019-02-20 NOTE — Progress Notes (Signed)
   Subjective:    Patient ID: Desiree Yu, female    DOB: 08/13/1960, 58 y.o.   MRN: [id number]  HPI She injured her left knee 4 days ago while playing golf.  She describes a hyperflexion injury to her knee but no pain at that time.  She then noted the next day an effusion and some quad tenderness especially going down steps.  No popping, locking or grinding.   Review of Systems     Objective:   Physical Exam Exam of left knee shows a moderate effusion.  No joint line tenderness.  Negative anterior drawer.  Medial and lateral collateral ligaments intact.  McMurray's testing negative.  She does have tenderness to palpation over the mid quad mechanism but no lesions were palpable.      Assessment & Plan:  Quadriceps strain, left, initial encounter  Effusion of left knee I suspect that the effusion is probably sympathetic in nature.  Recommend conservative care for both of these.  Since she is not having any pain, no insight is needed.  Recommend heat to the quad and return here if symptoms do not continue to dissipate.

## 2019-02-24 ENCOUNTER — Other Ambulatory Visit: Payer: Self-pay

## 2019-02-24 DIAGNOSIS — Z20822 Contact with and (suspected) exposure to covid-19: Secondary | ICD-10-CM

## 2019-02-25 LAB — NOVEL CORONAVIRUS, NAA: SARS-CoV-2, NAA: DETECTED — AB

## 2019-02-26 ENCOUNTER — Encounter (INDEPENDENT_AMBULATORY_CARE_PROVIDER_SITE_OTHER): Payer: Self-pay

## 2019-02-26 ENCOUNTER — Telehealth: Payer: Self-pay | Admitting: Family Medicine

## 2019-02-26 NOTE — Progress Notes (Signed)
She was recently Covid tested and positive but totally asymptomatic.  I discussed this with her and the symptoms to be concerned about of cough, congestion, shortness of breath, fever.  She will call if she has any problems.  Also explained that her husband needs to be quarantined for 2 weeks.

## 2019-02-26 NOTE — Addendum Note (Signed)
Addended by: Denita Lung on: 02/26/2019 01:19 PM   Modules accepted: Orders

## 2019-02-26 NOTE — Telephone Encounter (Signed)
RN from [facility] called to advised pt was positive for COVID, said pt and health dept have been notified.

## 2019-02-27 ENCOUNTER — Encounter (INDEPENDENT_AMBULATORY_CARE_PROVIDER_SITE_OTHER): Payer: Self-pay

## 2019-03-01 ENCOUNTER — Encounter: Payer: Self-pay | Admitting: Family Medicine

## 2019-03-01 ENCOUNTER — Encounter (INDEPENDENT_AMBULATORY_CARE_PROVIDER_SITE_OTHER): Payer: Self-pay

## 2019-03-02 ENCOUNTER — Encounter (INDEPENDENT_AMBULATORY_CARE_PROVIDER_SITE_OTHER): Payer: Self-pay

## 2019-03-03 ENCOUNTER — Encounter (INDEPENDENT_AMBULATORY_CARE_PROVIDER_SITE_OTHER): Payer: Self-pay

## 2019-03-04 ENCOUNTER — Encounter (INDEPENDENT_AMBULATORY_CARE_PROVIDER_SITE_OTHER): Payer: Self-pay

## 2019-03-05 ENCOUNTER — Encounter (INDEPENDENT_AMBULATORY_CARE_PROVIDER_SITE_OTHER): Payer: Self-pay

## 2019-03-06 ENCOUNTER — Encounter (INDEPENDENT_AMBULATORY_CARE_PROVIDER_SITE_OTHER): Payer: Self-pay

## 2019-03-07 ENCOUNTER — Encounter (INDEPENDENT_AMBULATORY_CARE_PROVIDER_SITE_OTHER): Payer: Self-pay

## 2019-03-08 ENCOUNTER — Encounter (INDEPENDENT_AMBULATORY_CARE_PROVIDER_SITE_OTHER): Payer: Self-pay

## 2019-03-09 ENCOUNTER — Encounter (INDEPENDENT_AMBULATORY_CARE_PROVIDER_SITE_OTHER): Payer: Self-pay

## 2019-03-10 ENCOUNTER — Encounter (INDEPENDENT_AMBULATORY_CARE_PROVIDER_SITE_OTHER): Payer: Self-pay

## 2019-03-11 ENCOUNTER — Encounter (INDEPENDENT_AMBULATORY_CARE_PROVIDER_SITE_OTHER): Payer: Self-pay

## 2019-05-05 ENCOUNTER — Encounter: Payer: Self-pay | Admitting: Family Medicine

## 2019-06-11 ENCOUNTER — Telehealth: Payer: Self-pay | Admitting: Family Medicine

## 2019-06-11 ENCOUNTER — Other Ambulatory Visit: Payer: Self-pay

## 2019-06-11 DIAGNOSIS — E785 Hyperlipidemia, unspecified: Secondary | ICD-10-CM

## 2019-06-11 MED ORDER — ATORVASTATIN CALCIUM 20 MG PO TABS: 20.0000 mg | ORAL_TABLET | Freq: Every day | ORAL | 3 refills | Status: DC

## 2019-06-11 NOTE — Telephone Encounter (Signed)
Pt called an appt for Friday. She has multi concerns but also wants labs drawn

## 2019-06-11 NOTE — Telephone Encounter (Signed)
Done KH 

## 2019-06-11 NOTE — Telephone Encounter (Signed)
Pt called for refill of atorvastatin. Please send to [facility] [address]

## 2019-06-13 ENCOUNTER — Ambulatory Visit: Payer: BC Managed Care – PPO | Admitting: Family Medicine

## 2019-06-13 ENCOUNTER — Other Ambulatory Visit: Payer: Self-pay

## 2019-06-13 VITALS — BP 138/88 | HR 72 | Temp 96.8°F | Wt 135.4 lb

## 2019-06-13 DIAGNOSIS — J0110 Acute frontal sinusitis, unspecified: Secondary | ICD-10-CM

## 2019-06-13 DIAGNOSIS — M7581 Other shoulder lesions, right shoulder: Secondary | ICD-10-CM

## 2019-06-13 DIAGNOSIS — E785 Hyperlipidemia, unspecified: Secondary | ICD-10-CM | POA: Diagnosis not present

## 2019-06-13 DIAGNOSIS — M7632 Iliotibial band syndrome, left leg: Secondary | ICD-10-CM | POA: Diagnosis not present

## 2019-06-13 DIAGNOSIS — Z79899 Other long term (current) drug therapy: Secondary | ICD-10-CM

## 2019-06-13 DIAGNOSIS — Z8616 Personal history of COVID-19: Secondary | ICD-10-CM

## 2019-06-13 MED ORDER — LIDOCAINE HCL 2 % IJ SOLN
2.0000 mL | Freq: Once | INTRAMUSCULAR | Status: AC
  Administered 2019-06-13: 40 mg via INTRADERMAL

## 2019-06-13 MED ORDER — TRIAMCINOLONE ACETONIDE 40 MG/ML IJ SUSP
40.0000 mg | Freq: Once | INTRAMUSCULAR | Status: AC
  Administered 2019-06-13: 40 mg via INTRAMUSCULAR

## 2019-06-13 MED ORDER — AMOXICILLIN-POT CLAVULANATE 875-125 MG PO TABS: 1.0000 | ORAL_TABLET | Freq: Two times a day (BID) | ORAL | 0 refills | Status: DC

## 2019-06-13 MED ORDER — ATORVASTATIN CALCIUM 20 MG PO TABS: 20.0000 mg | ORAL_TABLET | Freq: Every day | ORAL | 3 refills | Status: DC

## 2019-06-13 NOTE — Addendum Note (Signed)
Addended by: Renelda Loma on: 06/13/2019 03:32 PM   Modules accepted: Orders

## 2019-06-13 NOTE — Progress Notes (Signed)
   Subjective:    Patient ID: Desiree Yu, female    DOB: 24-Sep-1960, 59 y.o.   MRN: [id number]  HPI She is here for evaluation of multiple issues.  She does have a 2-week history of difficulty with headache that is mainly in the frontal area.  She has been using a Nettie pot for treatment of this and getting good results with within the last several days she has noted more nasal congestion has become purulent.  No fever, chills, sore throat, PND.  She does occasionally have upper tooth discomfort with that.  She also needs follow-up on her blood work.  She does have hyperlipidemia and presently is on Lipitor.  She is having no difficulty with that.  She also complains of left shoulder pain for the last several months.  She thinks she injured it when she fell down a hill while playing golf.  She notes a popping sensation in the left shoulder.  She complains of pain with motion in any direction.  She also thinks that her left hip area and the IT band is tighter than it should be.  She is having some difficulty with pain in that area but no numbness, tingling.  She does use yoga to help with this. Also of note is the fact that she was asymptomatic positive for Covid and wants verification with an antibody test.  Review of Systems     Objective:   Physical Exam Alert and in no distress. Tympanic membranes and canals are normal. Pharyngeal area is normal. Neck is supple without adenopathy or thyromegaly.  Nasal mucosa is normal.  Cardiac exam shows a regular sinus rhythm without murmurs or gallops. Lungs are clear to auscultation. Left shoulder exam shows full motion.  Negative drop arm test, Neer's and Hawkins test.  Negative sulcus test.  Normal strength. Full motion of the hip without pain.  Ober test is negative.  DTRs normal.  Negative straight leg raising.    Assessment & Plan:  Acute non-recurrent frontal sinusitis  Rotator cuff tendinitis, right - Plan: Ambulatory referral to Physical  Therapy  It band syndrome, left - Plan: Ambulatory referral to Physical Therapy  Hyperlipidemia with target LDL less than 100  History of COVID-19 - Plan: SAR CoV2 Serology (COVID 19)AB(IGG)IA  Encounter for long-term (current) use of medications - Plan: CBC with Differential/Platelet, Comprehensive metabolic panel, Lipid panel I will also give her Augmentin for her sinusitis. I discussed treatment of the shoulder with physical therapy as well as injection.  She agrees with an injection.  40 mg of Kenalog and 3 cc of Xylocaine was injected into the subacromial bursa without difficulty.  She did obtain relatively quick relief of her symptoms.

## 2019-06-14 LAB — COMPREHENSIVE METABOLIC PANEL
ALT: 16 IU/L (ref 0–32)
AST: 18 IU/L (ref 0–40)
Albumin/Globulin Ratio: 2.1 (ref 1.2–2.2)
Albumin: 5 g/dL — ABNORMAL HIGH (ref 3.8–4.9)
Alkaline Phosphatase: 67 IU/L (ref 39–117)
BUN/Creatinine Ratio: 21 (ref 12–28)
BUN: 13 mg/dL (ref 8–27)
Bilirubin Total: 0.8 mg/dL (ref 0.0–1.2)
CO2: 23 mmol/L (ref 20–29)
Calcium: 9.8 mg/dL (ref 8.7–10.3)
Chloride: 103 mmol/L (ref 96–106)
Creatinine, Ser: 0.63 mg/dL (ref 0.57–1.00)
GFR calc Af Amer: 113 mL/min/{1.73_m2} (ref >59)
GFR calc non Af Amer: 98 mL/min/{1.73_m2} (ref >59)
Globulin, Total: 2.4 g/dL (ref 1.5–4.5)
Glucose: 103 mg/dL — ABNORMAL HIGH (ref 65–99)
Potassium: 4.6 mmol/L (ref 3.5–5.2)
Sodium: 140 mmol/L (ref 134–144)
Total Protein: 7.4 g/dL (ref 6.0–8.5)

## 2019-06-14 LAB — CBC WITH DIFFERENTIAL/PLATELET
Basophils Absolute: 0.1 10*3/uL (ref 0.0–0.2)
Basos: 1 %
EOS (ABSOLUTE): 0.2 10*3/uL (ref 0.0–0.4)
Eos: 3 %
Hematocrit: 39.6 % (ref 34.0–46.6)
Hemoglobin: 13.8 g/dL (ref 11.1–15.9)
Immature Grans (Abs): 0 10*3/uL (ref 0.0–0.1)
Immature Granulocytes: 0 %
Lymphocytes Absolute: 2.1 10*3/uL (ref 0.7–3.1)
Lymphs: 39 %
MCH: 32.8 pg (ref 26.6–33.0)
MCHC: 34.8 g/dL (ref 31.5–35.7)
MCV: 94 fL (ref 79–97)
Monocytes Absolute: 0.5 10*3/uL (ref 0.1–0.9)
Monocytes: 9 %
Neutrophils Absolute: 2.6 10*3/uL (ref 1.4–7.0)
Neutrophils: 48 %
Platelets: 367 10*3/uL (ref 150–450)
RBC: 4.21 x10E6/uL (ref 3.77–5.28)
RDW: 12 % (ref 11.7–15.4)
WBC: 5.4 10*3/uL (ref 3.4–10.8)

## 2019-06-14 LAB — LIPID PANEL
Chol/HDL Ratio: 2.4 ratio (ref 0.0–4.4)
Cholesterol, Total: 201 mg/dL — ABNORMAL HIGH (ref 100–199)
HDL: 84 mg/dL (ref >39)
LDL Chol Calc (NIH): 102 mg/dL — ABNORMAL HIGH (ref 0–99)
Triglycerides: 85 mg/dL (ref 0–149)
VLDL Cholesterol Cal: 15 mg/dL (ref 5–40)

## 2019-06-14 LAB — SAR COV2 SEROLOGY (COVID19)AB(IGG),IA: DiaSorin SARS-CoV-2 Ab, IgG: NEGATIVE

## 2019-07-09 ENCOUNTER — Encounter: Payer: Self-pay | Admitting: Family Medicine

## 2019-09-30 LAB — HM MAMMOGRAPHY

## 2019-10-08 ENCOUNTER — Other Ambulatory Visit: Payer: Self-pay | Admitting: Family Medicine

## 2019-10-08 DIAGNOSIS — E785 Hyperlipidemia, unspecified: Secondary | ICD-10-CM

## 2019-12-03 ENCOUNTER — Telehealth: Payer: BC Managed Care – PPO | Admitting: Family Medicine

## 2019-12-03 ENCOUNTER — Other Ambulatory Visit: Payer: Self-pay

## 2019-12-03 ENCOUNTER — Encounter: Payer: Self-pay | Admitting: Family Medicine

## 2019-12-03 VITALS — Temp 98.6°F | Ht 60.0 in | Wt 135.0 lb

## 2019-12-03 DIAGNOSIS — J0110 Acute frontal sinusitis, unspecified: Secondary | ICD-10-CM

## 2019-12-03 MED ORDER — AMOXICILLIN-POT CLAVULANATE 875-125 MG PO TABS: 1.0000 | ORAL_TABLET | Freq: Two times a day (BID) | ORAL | 0 refills | Status: DC

## 2019-12-03 NOTE — Progress Notes (Signed)
   Subjective:    Patient ID: Desiree Yu, female    DOB: Feb 03, 1961, 59 y.o.   MRN: [id number]  HPI I connected with  [name] on 12/03/19 by a video enabled telemedicine application and verified that I am speaking with the correct person using two identifiers. I discussed the limitations of evaluation and management by telemedicine. The patient expressed understanding and agreed to proceed.  The encounter was carried out on the caregility.  I was in my office and she was unaccompanied in her home. She does have underlying allergies however in the last several weeks she has noted increased difficulty with sinus pressure especially left maxillary sinus and frontal areas with nasal congestion, postnasal drainage, upper tooth discomfort.  No fever, chills, sore throat or earache.  She continues on Flonase and has used nasal saline but still having symptoms.  She has had previous difficulty with sinus infections.   Review of Systems     Objective:   Physical Exam Alert and in no distress otherwise not examined       Assessment & Plan:  Acute non-recurrent frontal sinusitis - Plan: amoxicillin-clavulanate (AUGMENTIN) 875-125 MG tablet She is to take the entire antibiotic and if not totally back to normal when she finishes.  She is to call.  She was comfortable with that.

## 2020-01-15 ENCOUNTER — Telehealth: Payer: Self-pay

## 2020-01-15 DIAGNOSIS — J0110 Acute frontal sinusitis, unspecified: Secondary | ICD-10-CM

## 2020-01-15 MED ORDER — AMOXICILLIN-POT CLAVULANATE 875-125 MG PO TABS: 1.0000 | ORAL_TABLET | Freq: Two times a day (BID) | ORAL | 0 refills | Status: DC

## 2020-01-15 NOTE — Telephone Encounter (Signed)
Let her know that I called in an antibiotic 

## 2020-01-15 NOTE — Telephone Encounter (Signed)
Pt. Called stating that she was seen 6 weeks ago virtually because she had a sinus infection. She feels like her sinus infection is back again now having the same symptoms head pressure, and ears fill full again. She wanted to know if you could give her another round of medication or does she need another apt.

## 2020-01-15 NOTE — Telephone Encounter (Signed)
Pt. Aware that medication has been sent in. 

## 2020-02-04 HISTORY — PX: SKIN BIOPSY: SHX1

## 2020-02-17 ENCOUNTER — Other Ambulatory Visit: Payer: BC Managed Care – PPO

## 2020-02-17 DIAGNOSIS — Z20822 Contact with and (suspected) exposure to covid-19: Secondary | ICD-10-CM

## 2020-02-18 LAB — NOVEL CORONAVIRUS, NAA: SARS-CoV-2, NAA: NOT DETECTED

## 2020-02-18 LAB — SARS-COV-2, NAA 2 DAY TAT

## 2020-03-05 HISTORY — PX: OTHER SURGICAL HISTORY: SHX169

## 2020-03-08 ENCOUNTER — Encounter: Payer: Self-pay | Admitting: Family Medicine

## 2020-03-10 ENCOUNTER — Encounter: Payer: Self-pay | Admitting: Family Medicine

## 2020-03-10 ENCOUNTER — Ambulatory Visit: Payer: BC Managed Care – PPO | Admitting: Family Medicine

## 2020-03-10 ENCOUNTER — Other Ambulatory Visit: Payer: Self-pay

## 2020-03-10 VITALS — BP 136/90 | HR 75 | Temp 97.3°F | Wt 136.4 lb

## 2020-03-10 DIAGNOSIS — J301 Allergic rhinitis due to pollen: Secondary | ICD-10-CM | POA: Diagnosis not present

## 2020-03-10 DIAGNOSIS — R030 Elevated blood-pressure reading, without diagnosis of hypertension: Secondary | ICD-10-CM

## 2020-03-10 NOTE — Progress Notes (Signed)
   Subjective:    Patient ID: Desiree Yu, female    DOB: [birth year], 59 y.o.   MRN: [id number]  HPI She is here for consult concerning blood pressure.  She has had a few readings recently with her diastolic in the 9200 range and has her concerned.  She has been checking it at home on her husband's blood pressure machine.  It apparently has been calibrated and is accurate although the cuff is probably not the right size for her. She also has concerns about sinus pressure as well as headaches and does use Flonase but not on a regular basis.  Review of Systems     Objective:   Physical Exam Alert and in no distress.  Blood pressure is recorded.       Assessment & Plan:  Non-seasonal allergic rhinitis due to pollen  Elevated blood pressure reading Recommend she use Flonase regularly and if that does not work switch to Rhinocort.  If continued difficulty, call for virtual visit.  I then discussed blood pressure with her in terms of proper technique and proper cuff size.  Recommend she check her pressure once a week and send me a message on my chart after about a month to let me know how she is doing.  Also gave information concerning the DASH diet

## 2020-03-10 NOTE — Patient Instructions (Addendum)
DASH diet.  Do weekly blood pressure in the resting sitting position with her arm at heart level and send me a message Use Flonase regularly and if that does not work then switch to Rhinocort and see if that will work better and if you are still having problems set up a virtual visit

## 2020-03-16 ENCOUNTER — Encounter: Payer: Self-pay | Admitting: Family Medicine

## 2020-03-18 ENCOUNTER — Encounter: Payer: Self-pay | Admitting: Family Medicine

## 2020-03-29 ENCOUNTER — Ambulatory Visit: Payer: Self-pay

## 2020-03-29 ENCOUNTER — Ambulatory Visit: Payer: BC Managed Care – PPO | Attending: Internal Medicine

## 2020-03-29 DIAGNOSIS — Z23 Encounter for immunization: Secondary | ICD-10-CM

## 2020-03-29 NOTE — Progress Notes (Signed)
   Covid-19 Vaccination Clinic  Name:  CLARKE AMBURN    MRN: [id number] DOB: [birth year]  03/29/2020  Ms. Parlier was observed post Covid-19 immunization for 15 minutes without incident. She was provided with Vaccine Information Sheet and instruction to access the V-Safe system.   Ms. Lucero was instructed to call 911 with any severe reactions post vaccine: Marland Kitchen Difficulty breathing  . Swelling of face and throat  . A fast heartbeat  . A bad rash all over body  . Dizziness and weakness   Immunizations Administered    No immunizations on file.

## 2020-03-30 ENCOUNTER — Telehealth: Payer: Self-pay | Admitting: Family Medicine

## 2020-03-30 DIAGNOSIS — J0110 Acute frontal sinusitis, unspecified: Secondary | ICD-10-CM

## 2020-03-30 MED ORDER — LEVOFLOXACIN 500 MG PO TABS: 500.0000 mg | ORAL_TABLET | Freq: Every day | ORAL | 0 refills | Status: DC

## 2020-03-30 NOTE — Telephone Encounter (Signed)
Let her know that I called in a different antibiotic and if this does not work, I will refer to ENT.

## 2020-03-30 NOTE — Telephone Encounter (Signed)
She continues have difficulty with sinus infections.  I will give her Levaquin to see if that will help control her symptoms.  If no improvement, will refer to ENT.

## 2020-03-30 NOTE — Telephone Encounter (Signed)
Pt was called and lvm. Kh

## 2020-03-30 NOTE — Telephone Encounter (Signed)
Pt called and states that she continues to have on going issues with her sinuses. She states that she has had visits with [facility] recently. Pt was offered an appt todat and she states due to the amount of visits she's had for this issue she would like a note back. Pt states that at last appt she was given Rhinocort which made symptoms worse. She was also given flonase which did not help. She states she thinks it might be time for a antibiotic. She states the pressure is really bad and causing sleepless nights. Pt states that she would like a different antibiotic this time as she has been on some and it just keeps returning. Please advise pt at [phone]. [facility] [address].

## 2020-04-19 ENCOUNTER — Encounter: Payer: Self-pay | Admitting: Family Medicine

## 2020-04-19 DIAGNOSIS — J301 Allergic rhinitis due to pollen: Secondary | ICD-10-CM

## 2020-04-19 DIAGNOSIS — J0110 Acute frontal sinusitis, unspecified: Secondary | ICD-10-CM

## 2020-04-21 ENCOUNTER — Telehealth: Payer: Self-pay

## 2020-04-21 NOTE — Telephone Encounter (Signed)
Pt called about her referral to ent. .  A call was placed to Dr. Ezzard Standing office and they advised they would have to run it by Dr. Ezzard Standing to move up scheduling. Pt was called back and advised [facility]

## 2020-04-23 ENCOUNTER — Telehealth: Payer: Self-pay

## 2020-04-23 MED ORDER — LEVOFLOXACIN 500 MG PO TABS: 500.0000 mg | ORAL_TABLET | Freq: Every day | ORAL | 0 refills | Status: DC

## 2020-04-23 MED ORDER — PREDNISONE 10 MG (48) PO TBPK: ORAL_TABLET | ORAL | 0 refills | Status: DC

## 2020-04-23 NOTE — Telephone Encounter (Signed)
I will call in the Levaquin and give her a steroid Dosepak and have her use pain meds as needed and hopefully this might knock it out

## 2020-04-23 NOTE — Telephone Encounter (Signed)
Find out which antibiotic worked the best for her and I will also then call in a steroid and pain med if she needs one.

## 2020-04-23 NOTE — Telephone Encounter (Signed)
Pt has been having pain from sinus issues. Pt referral was placed and they will not be able to see pt until January. Please advise if we can send in something for her pain. KH

## 2020-04-23 NOTE — Telephone Encounter (Signed)
Pt says Levaquin worked best and will take the Ibuprofen and tylenol . Please advise [facility]

## 2020-05-10 ENCOUNTER — Encounter (INDEPENDENT_AMBULATORY_CARE_PROVIDER_SITE_OTHER): Payer: Self-pay | Admitting: Otolaryngology

## 2020-05-10 ENCOUNTER — Other Ambulatory Visit: Payer: Self-pay

## 2020-05-10 ENCOUNTER — Ambulatory Visit (INDEPENDENT_AMBULATORY_CARE_PROVIDER_SITE_OTHER): Payer: BC Managed Care – PPO | Admitting: Otolaryngology

## 2020-05-10 VITALS — Temp 97.7°F

## 2020-05-10 DIAGNOSIS — J310 Chronic rhinitis: Secondary | ICD-10-CM

## 2020-05-10 DIAGNOSIS — Z8709 Personal history of other diseases of the respiratory system: Secondary | ICD-10-CM

## 2020-05-10 NOTE — Progress Notes (Signed)
HPI: Desiree Yu is a 60 y.o. female who presents is referred by Dr. Susann Givens For evaluation of chronic sinus issues.  She apparently has had "sinus issues" for several years but this past year has been worse and she has been on 4 rounds of antibiotics.  She describes pressure in her forehead as well as in the back of the head.  She complains of intermittent nasal congestion and chronic postnasal drainage.  She has been on antibiotics and steroids that seemed to help temporarily.  She apparently has had a previous allergy work-up that was negative.  She describes thick postnasal drainage.  She has tried Flonase in the past but this does not seem to help. She also notices pressure in the head and sinuses when there is barometric pressure change or when she flies. She has not had any x-rays performed of her sinuses. She is allergic to erythromycin..  Past Medical History:  Diagnosis Date  . Allergy    RHINITIS  . Hemorrhoids   . Hx of colonic polyps   . IBS (irritable bowel syndrome)    Past Surgical History:  Procedure Laterality Date  . COLONOSCOPY  2011   Dr. Ewing Schlein  . SKIN BIOPSY Left 02/04/2020   skin shave biopsy, left anterior leg Verruca vulgaris  . skin ex Left 03/05/2020   SKin excision left anterior thorax: post surgical scar, No neoplasm is identiied   Social History   Socioeconomic History  . Marital status: Married    Spouse name: Not on file  . Number of children: Not on file  . Years of education: Not on file  . Highest education level: Not on file  Occupational History  . Not on file  Tobacco Use  . Smoking status: Never Smoker  . Smokeless tobacco: Never Used  Vaping Use  . Vaping Use: Never used  Substance and Sexual Activity  . Alcohol use: Yes    Alcohol/week: 7.0 standard drinks    Types: 7 Glasses of wine per week  . Drug use: No  . Sexual activity: Yes  Other Topics Concern  . Not on file  Social History Narrative  . Not on file   Social  Determinants of Health   Financial Resource Strain: Not on file  Food Insecurity: Not on file  Transportation Needs: Not on file  Physical Activity: Not on file  Stress: Not on file  Social Connections: Not on file   Family History  Problem Relation Age of Onset  . Hypertension Mother   . Arthritis Father   . Stroke Maternal Uncle   . Stroke Maternal Grandfather    Allergies  Allergen Reactions  . E-Mycin [Erythromycin Base] Rash   Prior to Admission medications   Medication Sig Start Date End Date Taking? Authorizing Provider  ALPRAZolam (XANAX) 0.25 MG tablet Take 1 tablet (0.25 mg total) by mouth as needed for anxiety. 05/06/18   Ronnald Nian, MD  Ascorbic Acid (VITAMIN C) 1000 MG tablet Take 1,000 mg by mouth daily.    [provider]  atorvastatin (LIPITOR) 20 MG tablet TAKE 1 TABLET(20 MG) BY MOUTH DAILY 10/09/19   Ronnald Nian, MD  Coenzyme Q10 (CO Q 10 PO) Take 1 capsule by mouth.    [provider]  COLLAGEN PO Take 6 tablets by mouth daily.    [provider]  fish oil-omega-3 fatty acids 1000 MG capsule Take 2 g by mouth daily.      [provider]  fluticasone (  FLONASE) 50 MCG/ACT nasal spray Place 2 sprays into both nostrils daily.    [provider]  levofloxacin (LEVAQUIN) 500 MG tablet Take 1 tablet (500 mg total) by mouth daily. 04/23/20   Ronnald Nian, MD  Multiple Vitamins-Minerals (MULTIVITAMIN WITH MINERALS) tablet Take 1 tablet by mouth daily.      [provider]  OVER THE COUNTER MEDICATION Zyflamend    [provider]  predniSONE (STERAPRED UNI-PAK 48 TAB) 10 MG (48) TBPK tablet Take as per manufacturer's recommendations Patient not taking: Reported on 05/10/2020 04/23/20   Ronnald Nian, MD  TURMERIC CURCUMIN PO Take by mouth.    [provider]  zolpidem (AMBIEN) 10 MG tablet Take 1 tablet (10 mg total) by mouth at bedtime as needed for sleep. Patient not taking: Reported on  12/03/2019 05/06/18 06/13/19  Ronnald Nian, MD     Positive ROS: Otherwise negative  All other systems have been reviewed and were otherwise negative with the exception of those mentioned in the HPI and as above.  Physical Exam: Constitutional: Alert, well-appearing, no acute distress Ears: External ears without lesions or tenderness.  She has some wax buildup in the left ear canal that was removed.  Ear canals and TMs are otherwise clear. Nasal: External nose without lesions. Septum with mild deformity and mild rhinitis..  After decongesting the nose nasal endoscopy was performed.  On nasal endoscopy she has septum slightly deviated to the left.  However both middle meatus regions were clear there is no polyps and minimal mucosal edema.  No mucopurulent discharge noted.  Posterior ethmoid and sphenoid regions were clear.  The nasopharynx was clear. Oral: Lips and gums without lesions. Tongue and palate mucosa without lesions. Posterior oropharynx clear. Neck: No palpable adenopathy or masses Respiratory: Breathing comfortably  Skin: No facial/neck lesions or rash noted.  Nasal/sinus endoscopy  Date/Time: 05/10/2020 7:00 PM Performed by: Drema Halon, MD Authorized by: Drema Halon, MD   Consent:    Consent obtained:  Verbal   Consent given by:  Patient Procedure details:    Indications: sino-nasal symptoms     Medication:  Afrin   Instrument: flexible fiberoptic nasal endoscope     Scope location: bilateral nare   Sinus:    Right middle meatus: normal     Left middle meatus: normal     Right nasopharynx: normal     Left nasopharynx: normal   Comments:     On nasal endoscopy both middle meatus regions were clear.  She does have some slight septal spurring on the left side.  But no clinical evidence of active infection or mucopurulent discharge.  No polyps noted or obstructing lesions.    Assessment: Chronic rhinitis with history of sinus disease  Plan: We  will plan on obtaining a CT scan to better evaluate the sinuses and have her follow-up following the sinus CT scan. In the meantime recommended regular use of nasal steroid spray and suggested trying Nasacort 2 sprays each nostril at night and reviewed with her concerning use of saline irrigation and suggested trying Xlear brand.   Narda Bonds, MD   CC:

## 2020-05-11 ENCOUNTER — Other Ambulatory Visit (INDEPENDENT_AMBULATORY_CARE_PROVIDER_SITE_OTHER): Payer: Self-pay

## 2020-05-11 DIAGNOSIS — J329 Chronic sinusitis, unspecified: Secondary | ICD-10-CM

## 2020-05-28 ENCOUNTER — Other Ambulatory Visit: Payer: Self-pay

## 2020-05-28 ENCOUNTER — Ambulatory Visit
Admission: RE | Admit: 2020-05-28 | Discharge: 2020-05-28 | Disposition: A | Discharge status: Home / Self Care | Payer: BC Managed Care – PPO | Source: Ambulatory Visit | Attending: Otolaryngology | Admitting: Otolaryngology

## 2020-05-28 DIAGNOSIS — J329 Chronic sinusitis, unspecified: Secondary | ICD-10-CM

## 2020-06-01 ENCOUNTER — Other Ambulatory Visit: Payer: Self-pay

## 2020-06-01 ENCOUNTER — Ambulatory Visit (INDEPENDENT_AMBULATORY_CARE_PROVIDER_SITE_OTHER): Payer: BC Managed Care – PPO | Admitting: Otolaryngology

## 2020-06-01 ENCOUNTER — Encounter (INDEPENDENT_AMBULATORY_CARE_PROVIDER_SITE_OTHER): Payer: Self-pay | Admitting: Otolaryngology

## 2020-06-01 VITALS — Temp 97.0°F

## 2020-06-01 DIAGNOSIS — J310 Chronic rhinitis: Secondary | ICD-10-CM

## 2020-06-01 DIAGNOSIS — H6983 Other specified disorders of Eustachian tube, bilateral: Secondary | ICD-10-CM | POA: Diagnosis not present

## 2020-06-01 DIAGNOSIS — J342 Deviated nasal septum: Secondary | ICD-10-CM

## 2020-06-01 NOTE — Progress Notes (Signed)
HPI: Desiree Yu is a 60 y.o. female who returns today for evaluation of chronic sinus complaints and review of her recent CT scan of the sinuses performed 4 days ago.  She has had frequent headaches as well as chronic postnasal drainage and intermittent nasal congestion.  She also describes sensation of fluid buildup below the left ear.  She has trouble frequently when she flies because of pressure in the sinuses and the ears. I reviewed the CT scan with the patient in the office today.  This showed a septal deviation to the right but clear paranasal sinuses otherwise.  She has small frontal sinuses which are clear bilaterally.  The ethmoid and maxillary sinuses were clear with widely patent OMC regions bilaterally.  She had a small polyp within the right sphenoid sinus but this is nonobstructing with no mucoperiosteal thickening and no air-fluid levels..  Past Medical History:  Diagnosis Date  . Allergy    RHINITIS  . Hemorrhoids   . Hx of colonic polyps   . IBS (irritable bowel syndrome)    Past Surgical History:  Procedure Laterality Date  . COLONOSCOPY  2011   Dr. Ewing Schlein  . SKIN BIOPSY Left 02/04/2020   skin shave biopsy, left anterior leg Verruca vulgaris  . skin ex Left 03/05/2020   SKin excision left anterior thorax: post surgical scar, No neoplasm is identiied   Social History   Socioeconomic History  . Marital status: Married    Spouse name: Not on file  . Number of children: Not on file  . Years of education: Not on file  . Highest education level: Not on file  Occupational History  . Not on file  Tobacco Use  . Smoking status: Never Smoker  . Smokeless tobacco: Never Used  Vaping Use  . Vaping Use: Never used  Substance and Sexual Activity  . Alcohol use: Yes    Alcohol/week: 7.0 standard drinks    Types: 7 Glasses of wine per week  . Drug use: No  . Sexual activity: Yes  Other Topics Concern  . Not on file  Social History Narrative  . Not on file   Social  Determinants of Health   Financial Resource Strain: Not on file  Food Insecurity: Not on file  Transportation Needs: Not on file  Physical Activity: Not on file  Stress: Not on file  Social Connections: Not on file   Family History  Problem Relation Age of Onset  . Hypertension Mother   . Arthritis Father   . Stroke Maternal Uncle   . Stroke Maternal Grandfather    Allergies  Allergen Reactions  . E-Mycin [Erythromycin Base] Rash   Prior to Admission medications   Medication Sig Start Date End Date Taking? Authorizing Provider  ALPRAZolam (XANAX) 0.25 MG tablet Take 1 tablet (0.25 mg total) by mouth as needed for anxiety. 05/06/18   Ronnald Nian, MD  Ascorbic Acid (VITAMIN C) 1000 MG tablet Take 1,000 mg by mouth daily.    [provider]  atorvastatin (LIPITOR) 20 MG tablet TAKE 1 TABLET(20 MG) BY MOUTH DAILY 10/09/19   Ronnald Nian, MD  Coenzyme Q10 (CO Q 10 PO) Take 1 capsule by mouth.    [provider]  COLLAGEN PO Take 6 tablets by mouth daily.    [provider]  fish oil-omega-3 fatty acids 1000 MG capsule Take 2 g by mouth daily.      [provider]  fluticasone (FLONASE) 50 MCG/ACT nasal spray  Place 2 sprays into both nostrils daily.    [provider]  levofloxacin (LEVAQUIN) 500 MG tablet Take 1 tablet (500 mg total) by mouth daily. 04/23/20   Ronnald Nian, MD  Multiple Vitamins-Minerals (MULTIVITAMIN WITH MINERALS) tablet Take 1 tablet by mouth daily.      [provider]  OVER THE COUNTER MEDICATION Zyflamend    [provider]  predniSONE (STERAPRED UNI-PAK 48 TAB) 10 MG (48) TBPK tablet Take as per manufacturer's recommendations Patient not taking: Reported on 05/10/2020 04/23/20   Ronnald Nian, MD  TURMERIC CURCUMIN PO Take by mouth.    [provider]  zolpidem (AMBIEN) 10 MG tablet Take 1 tablet (10 mg total) by mouth at bedtime as needed for sleep. Patient not taking: Reported on  12/03/2019 05/06/18 06/13/19  Ronnald Nian, MD     Positive ROS: Otherwise negative  All other systems have been reviewed and were otherwise negative with the exception of those mentioned in the HPI and as above.  Physical Exam: Constitutional: Alert, well-appearing, no acute distress Ears: External ears without lesions or tenderness. Ear canals are clear bilaterally with intact, clear TMs bilaterally. Nasal: External nose without lesions. Septum is deviated to the right.  Both middle meatus regions are clear.  Of note she did have some slight erythema of the anterior septum more on the right side but no obvious bleeding noted.  I suspect any bloody mucus she gets is from right Kiesselbach's plexus region..  Oral: Lips and gums without lesions. Tongue and palate mucosa without lesions. Posterior oropharynx clear. Neck: No palpable adenopathy or masses Respiratory: Breathing comfortably  Skin: No facial/neck lesions or rash noted.  Procedures  Assessment: Chronic rhinitis with no evidence of significant sinus disease on recent CT scan. Septal deviation to the right with moderate rhinitis and moderate turbinate size.  Plan: Reviewed the CT scan with the patient in the office today.  Discussed with her that there is no evidence of active sinus infection.  I would initially recommend regular use of nasal steroid spray and prescribed Nasacort to use 2 sprays each nostril at night.  Also discussed with her concerning using saline irrigations as needed for postnasal drainage and suggested trying Xlear. When she flies I recommended taking Sudafed or Afrin prior to flying.  As this should help with eustachian tube dysfunction as well as sinus pressure.  Have a regular use of nasal steroid spray Nasacort should also be beneficial. Discussed with her briefly concerning surgical options septoplasty and turbinate reductions if she continues to not get relief of her symptoms following the regular use of  the nasal steroid spray and saline rinses.  She will follow-up in 6 to 8 weeks if symptoms persist.   Narda Bonds, MD

## 2020-06-18 ENCOUNTER — Other Ambulatory Visit: Payer: BC Managed Care – PPO

## 2020-06-18 DIAGNOSIS — Z20822 Contact with and (suspected) exposure to covid-19: Secondary | ICD-10-CM

## 2020-06-19 LAB — NOVEL CORONAVIRUS, NAA: SARS-CoV-2, NAA: NOT DETECTED

## 2020-06-19 LAB — SARS-COV-2, NAA 2 DAY TAT

## 2020-07-10 ENCOUNTER — Other Ambulatory Visit: Payer: Self-pay | Admitting: Family Medicine

## 2020-07-10 DIAGNOSIS — E785 Hyperlipidemia, unspecified: Secondary | ICD-10-CM

## 2020-08-11 ENCOUNTER — Ambulatory Visit: Payer: BC Managed Care – PPO | Admitting: Family Medicine

## 2020-08-11 ENCOUNTER — Encounter: Payer: Self-pay | Admitting: Family Medicine

## 2020-08-11 ENCOUNTER — Telehealth: Payer: Self-pay

## 2020-08-11 ENCOUNTER — Other Ambulatory Visit: Payer: Self-pay

## 2020-08-11 VITALS — BP 140/88 | HR 74 | Temp 97.7°F | Ht 59.5 in | Wt 137.6 lb

## 2020-08-11 DIAGNOSIS — Z1152 Encounter for screening for COVID-19: Secondary | ICD-10-CM

## 2020-08-11 DIAGNOSIS — Z0000 Encounter for general adult medical examination without abnormal findings: Secondary | ICD-10-CM

## 2020-08-11 DIAGNOSIS — F418 Other specified anxiety disorders: Secondary | ICD-10-CM | POA: Diagnosis not present

## 2020-08-11 DIAGNOSIS — E785 Hyperlipidemia, unspecified: Secondary | ICD-10-CM | POA: Diagnosis not present

## 2020-08-11 DIAGNOSIS — J301 Allergic rhinitis due to pollen: Secondary | ICD-10-CM | POA: Diagnosis not present

## 2020-08-11 DIAGNOSIS — Z8371 Family history of colonic polyps: Secondary | ICD-10-CM

## 2020-08-11 DIAGNOSIS — G479 Sleep disorder, unspecified: Secondary | ICD-10-CM

## 2020-08-11 LAB — COMPREHENSIVE METABOLIC PANEL
ALT: 16 IU/L (ref 0–32)
AST: 19 IU/L (ref 0–40)
Albumin/Globulin Ratio: 2.1 (ref 1.2–2.2)
Albumin: 4.7 g/dL (ref 3.8–4.8)
Alkaline Phosphatase: 70 IU/L (ref 44–121)
BUN/Creatinine Ratio: 22 (ref 12–28)
BUN: 13 mg/dL (ref 8–27)
Bilirubin Total: 0.6 mg/dL (ref 0.0–1.2)
CO2: 21 mmol/L (ref 20–29)
Calcium: 9.3 mg/dL (ref 8.7–10.3)
Chloride: 102 mmol/L (ref 96–106)
Creatinine, Ser: 0.6 mg/dL (ref 0.57–1.00)
Globulin, Total: 2.2 g/dL (ref 1.5–4.5)
Glucose: 94 mg/dL (ref 65–99)
Potassium: 5 mmol/L (ref 3.5–5.2)
Sodium: 139 mmol/L (ref 134–144)
Total Protein: 6.9 g/dL (ref 6.0–8.5)
eGFR: 102 mL/min/{1.73_m2} (ref >59)

## 2020-08-11 LAB — CBC WITH DIFFERENTIAL/PLATELET
Basophils Absolute: 0.1 10*3/uL (ref 0.0–0.2)
Basos: 1 %
EOS (ABSOLUTE): 0.1 10*3/uL (ref 0.0–0.4)
Eos: 2 %
Hematocrit: 39.5 % (ref 34.0–46.6)
Hemoglobin: 13.7 g/dL (ref 11.1–15.9)
Immature Grans (Abs): 0 10*3/uL (ref 0.0–0.1)
Immature Granulocytes: 0 %
Lymphocytes Absolute: 1.6 10*3/uL (ref 0.7–3.1)
Lymphs: 29 %
MCH: 32.7 pg (ref 26.6–33.0)
MCHC: 34.7 g/dL (ref 31.5–35.7)
MCV: 94 fL (ref 79–97)
Monocytes Absolute: 0.6 10*3/uL (ref 0.1–0.9)
Monocytes: 12 %
Neutrophils Absolute: 3.1 10*3/uL (ref 1.4–7.0)
Neutrophils: 56 %
Platelets: 361 10*3/uL (ref 150–450)
RBC: 4.19 x10E6/uL (ref 3.77–5.28)
RDW: 13 % (ref 11.7–15.4)
WBC: 5.6 10*3/uL (ref 3.4–10.8)

## 2020-08-11 LAB — LIPID PANEL
Chol/HDL Ratio: 2.3 ratio (ref 0.0–4.4)
Cholesterol, Total: 180 mg/dL (ref 100–199)
HDL: 79 mg/dL (ref >39)
LDL Chol Calc (NIH): 89 mg/dL (ref 0–99)
Triglycerides: 61 mg/dL (ref 0–149)
VLDL Cholesterol Cal: 12 mg/dL (ref 5–40)

## 2020-08-11 MED ORDER — TRAZODONE HCL 50 MG PO TABS: 25.0000 mg | ORAL_TABLET | Freq: Every evening | ORAL | 1 refills | Status: DC | PRN

## 2020-08-11 MED ORDER — ALPRAZOLAM 0.25 MG PO TABS: 0.2500 mg | ORAL_TABLET | ORAL | 0 refills | Status: DC | PRN

## 2020-08-11 NOTE — Telephone Encounter (Signed)
I placed the order.

## 2020-08-11 NOTE — Progress Notes (Signed)
   Subjective:    Patient ID: Desiree Yu, female    DOB: 29-Mar-1961, 60 y.o.   MRN: [id number]  HPI She is here for complete examination.  She does have a trip planned to [place] and would like a refill on her sleep med as well as her Xanax to help with that.  She continues to do well on her atorvastatin.  Her allergies seem to be under good control on her present medication regimen.  She does have a family history of colonic polyps and is scheduled for routine follow-up on that.  She will take care of this when she comes back from her trip. She will also need Covid testing before she leaves.  Review of Systems  All other systems reviewed and are negative.      Objective:   Physical Exam Alert and in no distress. Tympanic membranes and canals are normal. Pharyngeal area is normal. Neck is supple without adenopathy or thyromegaly. Cardiac exam shows a regular sinus rhythm without murmurs or gallops. Lungs are clear to auscultation.       Assessment & Plan:  Routine general medical examination at a health care facility - Plan: CBC with Differential/Platelet, Comprehensive metabolic panel, Lipid panel  Situational anxiety - Plan: ALPRAZolam (XANAX) 0.25 MG tablet  Non-seasonal allergic rhinitis due to pollen  Hyperlipidemia with target LDL less than 100 - Plan: Lipid panel  Family history of colonic polyps  Encounter for screening for COVID-19 - Plan: POC COVID-19  Sleep disturbance - Plan: traZODone (DESYREL) 50 MG tablet

## 2020-08-11 NOTE — Telephone Encounter (Signed)
When pt. Checked out stated she got the ok from you to come in on 08/26/20 for a rapid covid test for travel if you could put in the order for her.

## 2020-08-26 ENCOUNTER — Other Ambulatory Visit: Payer: BC Managed Care – PPO

## 2020-08-26 ENCOUNTER — Other Ambulatory Visit: Payer: Self-pay

## 2020-08-26 DIAGNOSIS — Z1152 Encounter for screening for COVID-19: Secondary | ICD-10-CM

## 2020-08-26 NOTE — Progress Notes (Signed)
Formatting of this note might be different from the original.    Self Swab Type: Anterior Nasal    Time of patient swab  Electronically signed by Belia Heman., RPH at 08/26/2020  7:31 PM EDT

## 2020-08-27 ENCOUNTER — Ambulatory Visit: Payer: BC Managed Care – PPO | Admitting: Family Medicine

## 2020-08-27 ENCOUNTER — Encounter: Payer: Self-pay | Admitting: Family Medicine

## 2020-08-27 VITALS — BP 138/86 | HR 80 | Temp 99.2°F | Ht 60.0 in | Wt 136.0 lb

## 2020-08-27 DIAGNOSIS — J0100 Acute maxillary sinusitis, unspecified: Secondary | ICD-10-CM | POA: Diagnosis not present

## 2020-08-27 LAB — SARS-COV-2, NAA 2 DAY TAT

## 2020-08-27 LAB — NOVEL CORONAVIRUS, NAA: SARS-CoV-2, NAA: NOT DETECTED

## 2020-08-27 MED ORDER — AMOXICILLIN-POT CLAVULANATE 875-125 MG PO TABS
1.0000 | ORAL_TABLET | Freq: Two times a day (BID) | ORAL | 0 refills | Status: DC
Start: 2020-08-27 — End: 2020-10-01

## 2020-08-27 NOTE — Progress Notes (Signed)
   Subjective:    Patient ID: Desiree Yu, female    DOB: 1960-08-12, 60 y.o.   MRN: [id number]  HPI  She complains of an intermittent 1 month history of difficulty with right ear discomfort and slight right maxillary sinus discomfort.  No sore throat, fever, chills.  She does state that her upper teeth on the right do bother her slightly.  She is planning a trip to [place].  Review of Systems     Objective:   Physical Exam Alert and in no distress. Tympanic membranes and canals are normal. Pharyngeal area is normal. Neck is supple without adenopathy or thyromegaly.  Slight tenderness palpation over the right maxillary sinus.       Assessment & Plan:  Acute non-recurrent maxillary sinusitis - Plan: amoxicillin-clavulanate (AUGMENTIN) 875-125 MG tablet I think her symptoms are more sinus infection related.  Also recommend she use Afrin nasal spray before she gets on the plane.

## 2020-09-16 ENCOUNTER — Telehealth: Payer: Self-pay | Admitting: Family Medicine

## 2020-09-16 NOTE — Telephone Encounter (Signed)
Pt called and wants some advice she is in [place] right now and Is suppose to go to [place] tomorrow, she was seen for a sinus infection before she went out of the country, she had to be seen over there on the 26 because she was still having throat issues, states she feels like she has a hairball in her throat, states the dr over there done a blood test and said there was no inflammation and gave her some some throat spray. To help and told her to take some ibropfren to help, states when she went on her cruise and across she got worse. She has been trying warm salt gargle, and to eat soft foods to help, just some times it feel like a golf ball is in her throat, , She states everyday that she was on the cruise she was tested for covid and the last day on the cruise was the may the 3rd and it was negative,  Pt can be reached at [phone] Pt wants to know if you think she should come home

## 2020-09-30 ENCOUNTER — Ambulatory Visit: Payer: BC Managed Care – PPO | Admitting: Medical

## 2020-10-01 ENCOUNTER — Encounter: Payer: Self-pay | Admitting: Family Medicine

## 2020-10-01 ENCOUNTER — Other Ambulatory Visit: Payer: Self-pay

## 2020-10-01 ENCOUNTER — Ambulatory Visit: Payer: BC Managed Care – PPO | Admitting: Family Medicine

## 2020-10-01 VITALS — BP 110/74 | HR 87 | Temp 98.2°F | Wt 130.2 lb

## 2020-10-01 DIAGNOSIS — H6691 Otitis media, unspecified, right ear: Secondary | ICD-10-CM | POA: Diagnosis not present

## 2020-10-01 MED ORDER — CEFDINIR 300 MG PO CAPS: 300.0000 mg | ORAL_CAPSULE | Freq: Two times a day (BID) | ORAL | 0 refills | Status: DC

## 2020-10-01 NOTE — Progress Notes (Signed)
   Subjective:    Patient ID: Desiree Yu, female    DOB: Nov 24, 1960, 60 y.o.   MRN: [id number]  HPI Chief Complaint  Patient presents with  . swelling in throat    Swelling in throat. On side of right side. Seen Dr. Susann Givens back in April for this and thought it was sinus infection. Went to [place] and given antibiotic, gargling with water salt water and seems to help. Feels like her ear is filling up with fluid. Its not going away. Pain in upper back tooth. Dental appt next Thursday .    Complains of a one month history of sensation of something being stuck in her throat on the right side. Notes post nasal drainage. States she is constantly spitting up pleghm. No throat pain or difficulty swallowing.  Completed antibiotic for presumed sinus infection 3 weeks ago. She has been out of the country for the past month. States she saw a doctor in [place].   Pressure in her upper back tooth. appt with dentist next week.   Ibuprofen helps with her pain as well as azelstine nasal spray and salt water garlges.   She saw Dr. Ezzard Standing in January 2022 for chronic sinusitis.   Denies fever, chills, dizziness, chest pain, palpitations, shortness of breath, abdominal pain, V/D/.   Reviewed allergies, medications, past medical, surgical, family, and social history.   Review of Systems Pertinent positives and negatives in the history of present illness.     Objective:   Physical Exam Constitutional:      General: She is not in acute distress.    Appearance: Normal appearance. She is not ill-appearing.  HENT:     Right Ear: Ear canal and external ear normal. Decreased hearing noted. A middle ear effusion is present.     Left Ear: Tympanic membrane, ear canal and external ear normal.     Ears:     Comments: Exudate noted on right TM    Nose: Congestion present.     Mouth/Throat:     Mouth: Mucous membranes are moist.     Pharynx: Oropharynx is clear. Posterior oropharyngeal erythema  present. No oropharyngeal exudate.     Comments: Cobblestone appearance R>L Cardiovascular:     Rate and Rhythm: Normal rate and regular rhythm.     Pulses: Normal pulses.     Heart sounds: Normal heart sounds.  Pulmonary:     Effort: Pulmonary effort is normal.     Breath sounds: Normal breath sounds.  Musculoskeletal:     Cervical back: Normal range of motion and neck supple.  Lymphadenopathy:     Cervical: No cervical adenopathy.  Neurological:     Mental Status: She is alert.    BP 110/74   Pulse 87   Temp 98.2 F (36.8 C)   Wt 130 lb 3.2 oz (59.1 kg)   BMI 25.43 kg/m       Assessment & Plan:  Acute right otitis media - Plan: cefdinir (OMNICEF) 300 MG capsule  I will treat her with cefdinir since she recently completed Augmentin.  Continue symptomatic treatment and see dentist as scheduled for dental issue.  Follow up in one week or sooner if needed.

## 2020-10-01 NOTE — Patient Instructions (Signed)
Take the antibiotic and a probiotic or eat [place] yogurt for good bacteria.   Continue taking over the counter pain medication as needed.   Follow up next week

## 2020-10-07 ENCOUNTER — Ambulatory Visit: Payer: BC Managed Care – PPO | Admitting: Family Medicine

## 2020-10-07 ENCOUNTER — Encounter: Payer: Self-pay | Admitting: Family Medicine

## 2020-10-07 ENCOUNTER — Other Ambulatory Visit: Payer: Self-pay

## 2020-10-07 VITALS — BP 162/100 | HR 82 | Temp 97.2°F | Ht 59.5 in | Wt 128.8 lb

## 2020-10-07 DIAGNOSIS — H6691 Otitis media, unspecified, right ear: Secondary | ICD-10-CM | POA: Diagnosis not present

## 2020-10-07 MED ORDER — CIPROFLOXACIN HCL 500 MG PO TABS: 500.0000 mg | ORAL_TABLET | Freq: Two times a day (BID) | ORAL | 0 refills | Status: DC

## 2020-10-07 NOTE — Progress Notes (Signed)
   Subjective:    Patient ID: Desiree Yu, female    DOB: December 16, 1960, 60 y.o.   MRN: [id number]  HPI She is here for recheck.  She has been treated recently with Augmentin and recently switched to [facility] for otitis type symptoms.  She also apparently has had some tooth pain issues and states the antibiotic has helped that but still having difficulty with postnasal drainage especially on the right.  She thinks that the medication with the [facility] is only helped by roughly 40%.   Review of Systems     Objective:   Physical Exam Alert and in no distress.  Left TM and canal are normal.  Right TM does show some whitish type scarring otherwise normal.  Canals normal.  Neck is supple without adenopathy.       Assessment & Plan:  Acute right otitis media - Plan: ciprofloxacin (CIPRO) 500 MG tablet I will switch her to Cipro however if she continues have difficulty, I will refer her back to ENT.  Seems to be mainly a middle ear possibly eustachian tube dysfunction issue.

## 2020-10-11 ENCOUNTER — Other Ambulatory Visit: Payer: Self-pay | Admitting: Family Medicine

## 2020-10-11 DIAGNOSIS — E785 Hyperlipidemia, unspecified: Secondary | ICD-10-CM

## 2020-10-15 ENCOUNTER — Telehealth: Payer: Self-pay

## 2020-10-15 DIAGNOSIS — H6691 Otitis media, unspecified, right ear: Secondary | ICD-10-CM

## 2020-10-15 DIAGNOSIS — J0100 Acute maxillary sinusitis, unspecified: Secondary | ICD-10-CM

## 2020-10-15 NOTE — Telephone Encounter (Signed)
Pt states you told her to call after she was about finished with her antibiotic.  She states was better now feels like is getting fluid in it again, drainage never stopped.  Would like you to call her to discuss this and possibly referral to ENT.

## 2020-10-19 ENCOUNTER — Telehealth: Payer: Self-pay | Admitting: Family Medicine

## 2020-10-19 NOTE — Telephone Encounter (Signed)
Sent pt a message advising to call and keep a  check on cancellations at their office . K[facility]

## 2020-10-19 NOTE — Telephone Encounter (Signed)
Pt called and states that the ENT dr Ezzard Standing could not get her in until July 21st she wants to know if you can see about getting her in somewhere else sooner

## 2020-10-21 ENCOUNTER — Telehealth: Payer: Self-pay

## 2020-10-21 DIAGNOSIS — F418 Other specified anxiety disorders: Secondary | ICD-10-CM

## 2020-10-21 DIAGNOSIS — G479 Sleep disorder, unspecified: Secondary | ICD-10-CM

## 2020-10-21 MED ORDER — TRAZODONE HCL 50 MG PO TABS: 50.0000 mg | ORAL_TABLET | Freq: Every evening | ORAL | 1 refills | Status: DC | PRN

## 2020-10-21 MED ORDER — ALPRAZOLAM 0.25 MG PO TABS: 0.2500 mg | ORAL_TABLET | ORAL | 0 refills | Status: DC | PRN

## 2020-10-21 NOTE — Telephone Encounter (Signed)
Pt advised that she will need a new script for trazodone . Pt is taking 50 mg and would like to know if you can increase it due to waking in the middle of the night. She also need to renew xanax . Please advise [facility]

## 2020-10-21 NOTE — Addendum Note (Signed)
Addended by: Ronnald Nian on: 10/21/2020 04:56 PM   Modules accepted: Orders

## 2020-10-21 NOTE — Telephone Encounter (Signed)
PT was called [facility]

## 2020-10-26 ENCOUNTER — Other Ambulatory Visit: Payer: Self-pay | Admitting: Family Medicine

## 2020-10-26 DIAGNOSIS — G479 Sleep disorder, unspecified: Secondary | ICD-10-CM

## 2020-10-26 NOTE — Telephone Encounter (Signed)
[name] is requesting to fill pt trazodone. Please advise [facility] 

## 2020-11-11 ENCOUNTER — Encounter (HOSPITAL_COMMUNITY): Payer: Self-pay

## 2020-11-11 ENCOUNTER — Other Ambulatory Visit: Payer: Self-pay

## 2020-11-11 ENCOUNTER — Emergency Department (HOSPITAL_COMMUNITY)
Admission: EM | Admit: 2020-11-11 | Discharge: 2020-11-11 | Disposition: A | Discharge status: Home / Self Care | Payer: BC Managed Care – PPO | Attending: Emergency Medicine | Admitting: Emergency Medicine

## 2020-11-11 ENCOUNTER — Emergency Department (HOSPITAL_COMMUNITY): Payer: BC Managed Care – PPO

## 2020-11-11 DIAGNOSIS — I10 Essential (primary) hypertension: Secondary | ICD-10-CM | POA: Insufficient documentation

## 2020-11-11 DIAGNOSIS — R0989 Other specified symptoms and signs involving the circulatory and respiratory systems: Secondary | ICD-10-CM

## 2020-11-11 DIAGNOSIS — R1110 Vomiting, unspecified: Secondary | ICD-10-CM | POA: Diagnosis not present

## 2020-11-11 DIAGNOSIS — I160 Hypertensive urgency: Secondary | ICD-10-CM

## 2020-11-11 HISTORY — DX: Anxiety disorder, unspecified: F41.9

## 2020-11-11 HISTORY — DX: Otalgia, right ear: H92.01

## 2020-11-11 LAB — CBC
HCT: 41.7 % (ref 36.0–46.0)
Hemoglobin: 14.2 g/dL (ref 12.0–15.0)
MCH: 32.6 pg (ref 26.0–34.0)
MCHC: 34.1 g/dL (ref 30.0–36.0)
MCV: 95.6 fL (ref 80.0–100.0)
Platelets: 345 10*3/uL (ref 150–400)
RBC: 4.36 MIL/uL (ref 3.87–5.11)
RDW: 12.7 % (ref 11.5–15.5)
WBC: 7.1 10*3/uL (ref 4.0–10.5)
nRBC: 0 % (ref 0.0–0.2)

## 2020-11-11 LAB — BASIC METABOLIC PANEL
Anion gap: 12 (ref 5–15)
BUN: 10 mg/dL (ref 8–23)
CO2: 22 mmol/L (ref 22–32)
Calcium: 9.6 mg/dL (ref 8.9–10.3)
Chloride: 106 mmol/L (ref 98–111)
Creatinine, Ser: 0.56 mg/dL (ref 0.44–1.00)
GFR, Estimated: >60 mL/min (ref >60)
Glucose, Bld: 94 mg/dL (ref 70–99)
Potassium: 3.6 mmol/L (ref 3.5–5.1)
Sodium: 140 mmol/L (ref 135–145)

## 2020-11-11 MED ORDER — LORAZEPAM 0.5 MG PO TABS
0.5000 mg | ORAL_TABLET | Freq: Once | ORAL | Status: AC
  Administered 2020-11-11: 0.5 mg via ORAL
  Filled 2020-11-11: qty 1

## 2020-11-11 MED ORDER — HYDROCHLOROTHIAZIDE 12.5 MG PO TABS: 12.5000 mg | ORAL_TABLET | Freq: Every day | ORAL | 0 refills | Status: DC

## 2020-11-11 MED ORDER — HYDROCHLOROTHIAZIDE 12.5 MG PO CAPS
12.5000 mg | ORAL_CAPSULE | Freq: Once | ORAL | Status: AC
  Administered 2020-11-11: 12.5 mg via ORAL
  Filled 2020-11-11: qty 1

## 2020-11-11 NOTE — ED Triage Notes (Signed)
Arrived POV. Pt was eating dinner and started choking on her food, pt vomited and was able to get the food out. C/o heart pounding and fast heart rate after episode. Hx of anxiety.

## 2020-11-11 NOTE — ED Provider Notes (Signed)
[facility]-EMERGENCY DEPT Provider Note   CSN: [id number] Arrival date & time: 11/11/20  1951     History Chief Complaint  Patient presents with   Emesis    Food bolus, then vomited it out    Desiree Yu is a 60 y.o. female.  HPI Patient with a history of anxiety, and chronic right eustachian tube dysfunction presents after episode of possible aspiration.  She notes ongoing frustration with rhinitis like condition on the right side of her nose, face.  However, beyond that she was in her usual state of health.  Just prior to ED arrival patient was eating meat when she had a choking episode.  She had a sustained sensation of pain in the right upper thorax, for some time before it improved prior to ED arrival. currently, no pain. Patient has had no history of hypertension, but notes that she has had increasing blood pressure recently.    Past Medical History:  Diagnosis Date   Allergy    RHINITIS   Anxiety    Hemorrhoids    Hx of colonic polyps    IBS (irritable bowel syndrome)    Right ear pain     Patient Active Problem List   Diagnosis Date Noted   Family history of colonic polyps 04/05/2016   Allergic rhinitis due to pollen 11/11/2010   Hyperlipidemia with target LDL less than 100 11/11/2010   TALIPES CAVUS 03/08/2009    Past Surgical History:  Procedure Laterality Date   COLONOSCOPY  2011   Dr. Ewing Schlein   SKIN BIOPSY Left 02/04/2020   skin shave biopsy, left anterior leg Verruca vulgaris   skin ex Left 03/05/2020   SKin excision left anterior thorax: post surgical scar, No neoplasm is identiied     OB History   No obstetric history on file.     Family History  Problem Relation Age of Onset   Hypertension Mother    Arthritis Father    Stroke Maternal Uncle    Stroke Maternal Grandfather     Social History   Tobacco Use   Smoking status: Never   Smokeless tobacco: Never  Vaping Use   Vaping Use: Never used  Substance Use Topics    Alcohol use: Yes    Alcohol/week: 7.0 standard drinks    Types: 7 Glasses of wine per week   Drug use: No    Home Medications Prior to Admission medications   Medication Sig Start Date End Date Taking? Authorizing Provider  ALPRAZolam (XANAX) 0.25 MG tablet Take 1 tablet (0.25 mg total) by mouth as needed for anxiety. 10/21/20   Ronnald Nian, MD  Ascorbic Acid (VITAMIN C) 1000 MG tablet Take 1,000 mg by mouth daily.    [provider]  atorvastatin (LIPITOR) 20 MG tablet TAKE 1 TABLET(20 MG) BY MOUTH DAILY 10/11/20   Ronnald Nian, MD  cefdinir (OMNICEF) 300 MG capsule Take 1 capsule (300 mg total) by mouth 2 (two) times daily. 10/01/20   Henson, Vickie L, NP-C  ciprofloxacin (CIPRO) 500 MG tablet Take 1 tablet (500 mg total) by mouth 2 (two) times daily. 10/07/20   Ronnald Nian, MD  Coenzyme Q10 (CO Q 10 PO) Take 1 capsule by mouth.    [provider]  COLLAGEN PO Take 6 tablets by mouth daily.    [provider]  fish oil-omega-3 fatty acids 1000 MG capsule Take 2 g by mouth daily.    [provider]  ibuprofen (ADVIL)  200 MG tablet Take 200 mg by mouth every 6 (six) hours as needed.    [provider]  Multiple Vitamins-Minerals (MULTIVITAMIN WITH MINERALS) tablet Take 1 tablet by mouth daily.    [provider]  OVER THE COUNTER MEDICATION Zyflamend Patient not taking: No sig reported    [provider]  Sodium Chloride-Xylitol (XLEAR SINUS CARE SPRAY NA)  06/23/20   [provider]  traZODone (DESYREL) 50 MG tablet Take 1 tablet (50 mg total) by mouth at bedtime as needed for sleep. 10/21/20   Ronnald Nian, MD  triamcinolone (NASACORT) 55 MCG/ACT AERO nasal inhaler  06/23/20   [provider]  TURMERIC CURCUMIN PO Take by mouth.    [provider]    Allergies    E-mycin [erythromycin base]  Review of Systems   Review of Systems  Constitutional:        Per HPI, otherwise negative   HENT:         Per HPI, otherwise negative  Respiratory:         Per HPI, otherwise negative  Cardiovascular:        Per HPI, otherwise negative  Gastrointestinal:  Negative for vomiting.  Endocrine:       Negative aside from HPI  Genitourinary:        Neg aside from HPI   Musculoskeletal:        Per HPI, otherwise negative  Skin: Negative.   Neurological:  Negative for syncope.   Physical Exam Updated Vital Signs BP (!) 175/95   Pulse 74   Temp 97.6 F (36.4 C) (Oral)   Resp 17   Ht 4\' 11"  (1.499 m)   Wt 58.1 kg   SpO2 97%   BMI 25.85 kg/m   Physical Exam Vitals and nursing note reviewed.  Constitutional:      General: She is not in acute distress.    Appearance: She is well-developed.  HENT:     Head: Normocephalic and atraumatic.  Eyes:     Conjunctiva/sclera: Conjunctivae normal.  Cardiovascular:     Rate and Rhythm: Normal rate and regular rhythm.  Pulmonary:     Effort: Pulmonary effort is normal. No respiratory distress.     Breath sounds: Normal breath sounds. No stridor.  Abdominal:     General: There is no distension.  Skin:    General: Skin is warm and dry.  Neurological:     Mental Status: She is alert and oriented to person, place, and time.     Cranial Nerves: No cranial nerve deficit.    ED Results / Procedures / Treatments   Labs (all labs ordered are listed, but only abnormal results are displayed) Labs Reviewed  BASIC METABOLIC PANEL  CBC    EKG None  Radiology DG Chest 2 View  Result Date: 11/11/2020 CLINICAL DATA:  Choking on food.  Possible aspiration. EXAM: CHEST - 2 VIEW COMPARISON:  05/10/2011 FINDINGS: The heart size and mediastinal contours are within normal limits. Both lungs are clear. The visualized skeletal structures are unremarkable. No radiopaque foreign bodies are demonstrated. IMPRESSION: No active cardiopulmonary disease. Electronically Signed   By: 07/08/2011 M.D.   On: 11/11/2020 21:36     Procedures Procedures   Medications Ordered in ED Medications  LORazepam (ATIVAN) tablet 0.5 mg (0.5 mg Oral Given 11/11/20 2105)    ED Course  I have reviewed the triage vital signs and the nursing notes.  Pertinent labs & imaging results that were  available during my care of the patient were reviewed by me and considered in my medical decision making (see chart for details).  Patient placed on monitors soon after arrival.  Blood pressure notable, 185/115.  Heart rate 80s sinus normal Pulse ox 96% room air normal 10:37 PM Patient awake, alert, speaking clearly.  She is now committed by her husband. Together we discussed all findings including reassuring x-ray with no evidence for occlusion.  Labs without evidence for endorgan damage given her hypertension. We again discussed her ongoing issues, and they request referral to a different ENT physician for follow-up for her right eustachian tube dysfunction.  This will be accommodated. As the patient has previously recognized hypertension, we discussed risks and benefits of starting medication now versus following up with her physician, and she and her husband prefer initiation today, which was accommodated. Without distress, without dyspnea, without evidence for pneumomediastinum, occlusion, postevent pneumonia or other acute phenomena, patient discharged in stable condition. MDM Rules/Calculators/A&P MDM Number of Diagnoses or Management Options Choking episode: new, needed workup Hypertensive urgency: new, needed workup   Amount and/or Complexity of Data Reviewed Clinical lab tests: reviewed and ordered Tests in the radiology section of CPT: ordered and reviewed Tests in the medicine section of CPT: ordered and reviewed Decide to obtain previous medical records or to obtain history from someone other than the patient: yes Obtain history from someone other than the patient: yes Review and summarize past medical records:  yes Independent visualization of images, tracings, or specimens: yes  Risk of Complications, Morbidity, and/or Mortality Presenting problems: high Diagnostic procedures: high Management options: high  Critical Care Total time providing critical care: < 30 minutes  Patient Progress Patient progress: stable   Final Clinical Impression(s) / ED Diagnoses Final diagnoses:  Choking episode  Hypertensive urgency    Rx / DC Orders ED Discharge Orders          Ordered    hydrochlorothiazide (HYDRODIURIL) 12.5 MG tablet  Daily        11/11/20 2240             Gerhard Munch, MD 11/11/20 2241

## 2020-11-11 NOTE — Discharge Instructions (Addendum)
As discussed, your evaluation today has been largely reassuring.  But, it is important that you monitor your condition carefully, and do not hesitate to return to the ED if you develop new, or concerning changes in your condition.  Otherwise, please follow-up with your physician for appropriate ongoing care.  In addition, please use the provided contact information for ENT follow-up as desired.

## 2020-11-11 NOTE — ED Notes (Signed)
Pt husband at bedside

## 2020-11-25 ENCOUNTER — Ambulatory Visit (INDEPENDENT_AMBULATORY_CARE_PROVIDER_SITE_OTHER): Payer: BC Managed Care – PPO | Admitting: Otolaryngology

## 2020-11-30 ENCOUNTER — Ambulatory Visit: Payer: BC Managed Care – PPO | Admitting: Family Medicine

## 2020-11-30 ENCOUNTER — Other Ambulatory Visit: Payer: Self-pay

## 2020-11-30 VITALS — BP 122/82 | HR 87 | Temp 98.9°F | Wt 123.4 lb

## 2020-11-30 DIAGNOSIS — G479 Sleep disorder, unspecified: Secondary | ICD-10-CM

## 2020-11-30 DIAGNOSIS — I10 Essential (primary) hypertension: Secondary | ICD-10-CM

## 2020-11-30 DIAGNOSIS — F418 Other specified anxiety disorders: Secondary | ICD-10-CM

## 2020-11-30 DIAGNOSIS — H6981 Other specified disorders of Eustachian tube, right ear: Secondary | ICD-10-CM

## 2020-11-30 DIAGNOSIS — K219 Gastro-esophageal reflux disease without esophagitis: Secondary | ICD-10-CM

## 2020-11-30 DIAGNOSIS — T17308A Unspecified foreign body in larynx causing other injury, initial encounter: Secondary | ICD-10-CM

## 2020-11-30 DIAGNOSIS — R590 Localized enlarged lymph nodes: Secondary | ICD-10-CM

## 2020-11-30 MED ORDER — HYDROCHLOROTHIAZIDE 12.5 MG PO TABS
12.5000 mg | ORAL_TABLET | Freq: Every day | ORAL | 3 refills | Status: DC
Start: 2020-11-30 — End: 2021-12-15

## 2020-11-30 MED ORDER — CITALOPRAM HYDROBROMIDE 20 MG PO TABS: 20.0000 mg | ORAL_TABLET | Freq: Every day | ORAL | 3 refills | Status: DC

## 2020-11-30 MED ORDER — TRAZODONE HCL 50 MG PO TABS
50.0000 mg | ORAL_TABLET | Freq: Every evening | ORAL | 1 refills | Status: DC | PRN
Start: 2020-11-30 — End: 2021-03-01

## 2020-11-30 NOTE — Progress Notes (Signed)
   Subjective:    Patient ID: Desiree Yu, female    DOB: 07/27/1960, 60 y.o.   MRN: [id number]  HPI She is here for a recheck.  She has had a great deal of time difficulty with continued right neck discomfort.  She has been seen by me, ENT, audiology.  She has been getting a mixed bag of different issues including eustachian tube dysfunction, possible GERD none of which is really helped truly identify the full problem.  She complains of a fullness in the right neck area as well as some drainage.  She was seen recently in the emergency room and treated for swallowing dysfunction.  Her blood pressure at that time was elevated and she is placed on HCTZ.  She is quite anxious over all this and did become tearful at 1 point in time.  She would like something for her anxiety.   Review of Systems     Objective:   Physical Exam Alert and slightly tearful.  TMs do show some scarring.  Throat shows questionable slight enlargement of the right tonsil.  Neck is supple with some fullness in the right anterior cervical area.  Thyroid is normal.       Assessment & Plan:  Lymphadenopathy, cervical - Plan: CT Soft Tissue Neck W Contrast  Situational anxiety  Choking, initial encounter - Plan: CT Soft Tissue Neck W Contrast  Eustachian tube dysfunction, right - Plan: CT Soft Tissue Neck W Contrast  Gastroesophageal reflux disease without esophagitis  Essential hypertension I think a CT scan of this area would be quite useful to look at the entire anatomy. I will start her on low-dose Celexa with the idea of hopefully stopping the trazodone for her sleep.  Cautioned her about this.  I will also place her on HCTZ after review of her blood pressures over the last several years, it is time to treat. Encouraged her to go ahead and use Prilosec regularly for a month to see if that possibly will help with some of her symptoms.  Follow-up here in 1 month.  Over 45 minutes spent counseling and coordination of  care.  Recheck here in a month.

## 2020-12-22 ENCOUNTER — Ambulatory Visit
Admission: RE | Admit: 2020-12-22 | Discharge: 2020-12-22 | Disposition: A | Discharge status: Home / Self Care | Payer: BC Managed Care – PPO | Source: Ambulatory Visit | Attending: Family Medicine | Admitting: Family Medicine

## 2020-12-22 ENCOUNTER — Other Ambulatory Visit: Payer: Self-pay

## 2020-12-22 MED ORDER — IOPAMIDOL (ISOVUE-370) INJECTION 76%
75.0000 mL | Freq: Once | INTRAVENOUS | Status: AC | PRN
  Administered 2020-12-22: 75 mL via INTRAVENOUS

## 2020-12-23 ENCOUNTER — Encounter: Payer: Self-pay | Admitting: Family Medicine

## 2020-12-28 ENCOUNTER — Other Ambulatory Visit: Payer: Self-pay

## 2020-12-28 ENCOUNTER — Ambulatory Visit: Payer: BC Managed Care – PPO | Admitting: Family Medicine

## 2020-12-28 VITALS — BP 140/88 | HR 64 | Temp 97.2°F | Wt 124.8 lb

## 2020-12-28 DIAGNOSIS — J301 Allergic rhinitis due to pollen: Secondary | ICD-10-CM

## 2020-12-28 DIAGNOSIS — R590 Localized enlarged lymph nodes: Secondary | ICD-10-CM | POA: Diagnosis not present

## 2020-12-28 DIAGNOSIS — H6981 Other specified disorders of Eustachian tube, right ear: Secondary | ICD-10-CM | POA: Diagnosis not present

## 2020-12-28 MED ORDER — AMOXICILLIN-POT CLAVULANATE 875-125 MG PO TABS: 1.0000 | ORAL_TABLET | Freq: Two times a day (BID) | ORAL | 0 refills | Status: DC

## 2020-12-28 NOTE — Progress Notes (Signed)
   Subjective:    Patient ID: Desiree Yu, female    DOB: Sep 14, 1960, 60 y.o.   MRN: [id number]  HPI She is here for consult concerning continued difficulty with right neck and throat discomfort.  Review of the record indicates that she had been treated for sinus infection and then for otitis media.  She has had Augmentin, cefdinir and Cipro for treatment of this.  She continued to have difficulty with that.  She was seen July 13 by ENT and endoscopy did show evidence of reflux.  She was placed on a PPI and told to take it for 3 months however she has been taking it for a month.  She was also told to change her diet and she has done that.  She notes a slight decrease in mucus production.  Recent CT scan did show slight lymph node swelling on the right.  She continues to complain of pain in that area as well as right upper tooth discomfort.  She apparently has had a root canal on that side as well.  She is concerned that she has not returned to normal.  She does state that her right tonsil seems to be large.  She also has an underlying history of allergies and sometimes does take Claritin as well as Nasacort.  She does not think she is having a lot of difficulty with that at the present time.   Review of Systems     Objective:   Physical Exam Alert and in no distress.  Exam of the throat shows the tonsils to be normal in size.  No lesions were seen.  Neck is supple with slight fullness in the right submandibular area consistent with the CT scan of the lymphadenopathy.       Assessment & Plan:  Eustachian tube dysfunction, right  Lymphadenopathy, cervical  Non-seasonal allergic rhinitis due to pollen She is very frustrated over the lack of any progress on all this.  I discussed the various diagnoses and treatments with her.  Explained that at the present time there is no actual definite diagnosis.  Discussed treating the allergies more aggressively with Claritin-D and Nasacort as well as  continuing on the Prilosec and adding an antibiotic fully recognizing that at this point there is really no evidence of infection.  She is decided to do all of these.  She will stay on the Claritin-D and Nasacort as well as Prilosec for the next month.  I will give her Augmentin for 2 weeks and then she is to call me to let me know how she is doing.  If she is making an improvement I will give her another 2 weeks worth.

## 2020-12-28 NOTE — Patient Instructions (Signed)
Continue Prilosec for the next month.  Start on Claritin-D and Nasacort.  Call me at the end of 2 weeks of the antibiotic and let me know how you are doing

## 2021-01-04 ENCOUNTER — Encounter: Payer: Self-pay | Admitting: Family Medicine

## 2021-01-04 ENCOUNTER — Other Ambulatory Visit: Payer: Self-pay | Admitting: Family Medicine

## 2021-01-04 DIAGNOSIS — E785 Hyperlipidemia, unspecified: Secondary | ICD-10-CM

## 2021-01-05 ENCOUNTER — Ambulatory Visit: Payer: BC Managed Care – PPO | Admitting: Family Medicine

## 2021-01-05 ENCOUNTER — Other Ambulatory Visit: Payer: Self-pay

## 2021-01-05 ENCOUNTER — Encounter: Payer: Self-pay | Admitting: Family Medicine

## 2021-01-05 VITALS — BP 132/70 | HR 80 | Temp 98.2°F | Ht 59.5 in | Wt 126.8 lb

## 2021-01-05 DIAGNOSIS — Z23 Encounter for immunization: Secondary | ICD-10-CM | POA: Diagnosis not present

## 2021-01-05 DIAGNOSIS — R590 Localized enlarged lymph nodes: Secondary | ICD-10-CM | POA: Diagnosis not present

## 2021-01-05 DIAGNOSIS — R070 Pain in throat: Secondary | ICD-10-CM

## 2021-01-05 NOTE — Progress Notes (Signed)
Chief Complaint  Patient presents with   Consult    Has been dealing with mucus in her throat and white spots on her tonsils. Has been gargling with warm salt water off and on and the spots have seemed to disappear. No ST, no pain. Feels like the right tonsil is swollen. Mentions that she has holes in her tonsils. Still on abx given by J[facility]-day 7.     She saw Dr. Susann Givens on 8/23, and was prescribed Augmentin x 2 weeks.  She is tolerating this without side effects, taking Florastor probiotic along with it. She overall is feeling a little better since taking it, denies fevers.  Denies throat pain.  Sometimes she feels like something is swelling on the R side--this has been ongoing. Really felt this on Saturday. She also had a lot of mucus draining on that day. Mucus is thick, hard to get out, clear, sometimes white.  Yesterday she noted one large spot and several smaller white spots on/in the R tonsil. She first noted this on 8/28. This morning she still noted a white spot, but this resolved later today. She has been doing gargles, and also sinus rinses.  She notes that her mouth is dry at night, thinks from HCTZ. Wonders if this contributes at all to her issues.   Prior history reviewed, from Dr. Jola Babinski last note--ENT visit last month, e/o reflux on scope, put on PPI.  CT scan showed slight lymph node swelling on the right.    PMH, PSH, SH reviewed  Outpatient Encounter Medications as of 01/05/2021  Medication Sig Note   amoxicillin-clavulanate (AUGMENTIN) 875-125 MG tablet Take 1 tablet by mouth 2 (two) times daily.    Ascorbic Acid (VITAMIN C) 1000 MG tablet Take 1,000 mg by mouth daily.    atorvastatin (LIPITOR) 20 MG tablet TAKE 1 TABLET(20 MG) BY MOUTH DAILY    Coenzyme Q10 (CO Q 10 PO) Take 1 capsule by mouth.    COLLAGEN PO Take 6 tablets by mouth daily.    fish oil-omega-3 fatty acids 1000 MG capsule Take 1 g by mouth daily.    hydrochlorothiazide (HYDRODIURIL) 12.5 MG tablet  Take 1 tablet (12.5 mg total) by mouth daily.    ibuprofen (ADVIL) 200 MG tablet Take 200 mg by mouth every 6 (six) hours as needed. 01/05/2021: Took one last night in middle of night   Melatonin 1 MG CAPS     Multiple Vitamins-Minerals (MULTIVITAMIN WITH MINERALS) tablet Take 1 tablet by mouth daily.    Omeprazole Magnesium (PRILOSEC OTC PO) Take 1 capsule by mouth daily.    Sodium Chloride-Xylitol (XLEAR SINUS CARE SPRAY NA)     traZODone (DESYREL) 50 MG tablet Take 1 tablet (50 mg total) by mouth at bedtime as needed for sleep.    Triamcinolone Acetonide (NASACORT AQ NA) Place 2 sprays into the nose daily.    TURMERIC CURCUMIN PO Take by mouth.    ALPRAZolam (XANAX) 0.25 MG tablet Take 1 tablet (0.25 mg total) by mouth as needed for anxiety. (Patient not taking: Reported on 01/05/2021)    OVER THE COUNTER MEDICATION Zyflamend (Patient not taking: No sig reported)    No facility-administered encounter medications on file as of 01/05/2021.   Allergies  Allergen Reactions   E-Mycin [Erythromycin Base] Rash   ROS: no fever, chills, URI symptoms other than the PND and R sided throat symptoms per HPI.  No n/v/d, ear pain, rashes or other complaints.   PHYSICAL EXAM:  BP 132/70  Pulse 80   Temp 98.2 F (36.8 C) (Tympanic)   Ht 4' 11.5" (1.511 m)   Wt 126 lb 12.8 oz (57.5 kg)   BMI 25.18 kg/m   Well-appearing, pleasant female, in no distress HEENT: conjunctiva and sclera are clear, EOMI. OP:  moist mucus membranes without any lesions. Very small white spot noted only when pt gagged, at 7 o'clock position of the R tonsil Tonsil is without erythema and not enlarged. Neck: no lymphadenopathy appreciated, nontender Heart: regular rate and rhythm Lungs: clear   ASSESSMENT/PLAN:   Pain in throat - intermittent fullness/swelling on right side.  Suspect related to PND, as well as issues with tonsillar concretions. Supportive measures reviewed  Need for influenza vaccination - Plan:  Flu Vaccine QUAD 6+ mos PF IM (Fluarix Quad PF)  Lymphadenopathy, cervical - this appears to be resolving with the antibiotic; complete course of augmentin  Stay well hydrated--drink lots of water. Continue sinus rinses regularly. Consider trying Mucinex 12 hour twice daily (plain). This may help keep the secretion (nasal/drainage/phlegm) thinner and make it easier to clear. Continue salt water gargles as needed. I suspect you had tonsillar concretions (tonsillith, stone, etc).

## 2021-01-05 NOTE — Patient Instructions (Signed)
  Stay well hydrated--drink lots of water. Continue sinus rinses regularly. Consider trying Mucinex 12 hour twice daily (plain). This may help keep the secretion (nasal/drainage/phlegm) thinner and make it easier to clear. Continue salt water gargles as needed. I suspect you had tonsillar concretions (tonsillith, stone, etc).

## 2021-01-11 ENCOUNTER — Telehealth: Payer: Self-pay

## 2021-01-11 MED ORDER — AMOXICILLIN-POT CLAVULANATE 875-125 MG PO TABS: 1.0000 | ORAL_TABLET | Freq: Two times a day (BID) | ORAL | 0 refills | Status: DC

## 2021-01-11 NOTE — Telephone Encounter (Signed)
Pt called and states today is last day of antibiotics and states she is a little better but not resolved.  She states you told her to call when she was about finished for instructions of what to do next.  Please advise.

## 2021-01-18 LAB — HM COLONOSCOPY

## 2021-01-18 NOTE — Telephone Encounter (Signed)
Left message for pt to follow up.

## 2021-01-18 NOTE — Telephone Encounter (Signed)
Pt states is feeling better teeth still hurting some had colonoscopy so delayed starting taking medication. Has been taking decongestant also. Will let us know after she finishes the course of medication how she is feeling.

## 2021-01-31 ENCOUNTER — Telehealth: Payer: Self-pay | Admitting: *Deleted

## 2021-01-31 NOTE — Telephone Encounter (Signed)
Patient called in and said that she has finished the second round of abx, very mininally better. Would like to know what the next step is?

## 2021-03-01 ENCOUNTER — Other Ambulatory Visit: Payer: Self-pay | Admitting: Family Medicine

## 2021-03-01 DIAGNOSIS — G479 Sleep disorder, unspecified: Secondary | ICD-10-CM

## 2021-03-01 NOTE — Telephone Encounter (Signed)
[name] is requesting to fill pt trazodone. Please advise [facility] 

## 2021-03-02 ENCOUNTER — Other Ambulatory Visit: Payer: Self-pay | Admitting: Family Medicine

## 2021-03-02 DIAGNOSIS — Z1231 Encounter for screening mammogram for malignant neoplasm of breast: Secondary | ICD-10-CM

## 2021-04-06 ENCOUNTER — Ambulatory Visit
Admission: RE | Admit: 2021-04-06 | Discharge: 2021-04-06 | Disposition: A | Discharge status: Home / Self Care | Payer: BC Managed Care – PPO | Source: Ambulatory Visit

## 2021-04-06 DIAGNOSIS — Z1231 Encounter for screening mammogram for malignant neoplasm of breast: Secondary | ICD-10-CM

## 2021-04-25 ENCOUNTER — Other Ambulatory Visit: Payer: Self-pay | Admitting: Family Medicine

## 2021-04-25 DIAGNOSIS — G479 Sleep disorder, unspecified: Secondary | ICD-10-CM

## 2021-04-25 NOTE — Telephone Encounter (Signed)
[name] is requesting to fill pt trazodone. Please advise [facility] 

## 2021-06-03 ENCOUNTER — Ambulatory Visit: Payer: BC Managed Care – PPO | Admitting: Family Medicine

## 2021-06-03 ENCOUNTER — Encounter: Payer: Self-pay | Admitting: Family Medicine

## 2021-06-03 ENCOUNTER — Other Ambulatory Visit: Payer: Self-pay

## 2021-06-03 VITALS — BP 136/86 | HR 75 | Temp 98.0°F | Wt 131.0 lb

## 2021-06-03 DIAGNOSIS — H60331 Swimmer's ear, right ear: Secondary | ICD-10-CM

## 2021-06-03 MED ORDER — NEOMYCIN-POLYMYXIN-HC 3.5-10000-1 OT SUSP: 3.0000 [drp] | Freq: Three times a day (TID) | OTIC | 0 refills | Status: DC

## 2021-06-03 NOTE — Progress Notes (Signed)
   Subjective:    Patient ID: Desiree Yu, female    DOB: [birth year], 62 y.o.   MRN: [id number]  HPI She complains of a 1 week history of right ear discomfort.  She has started a swimming program and is concerned about swimmer's ear.  No fever, chills, sore throat.   Review of Systems     Objective:   Physical Exam Alert and in no distress.  Left TM and canal is normal.  Right TM is normal but the canal does appear slightly swollen.  Manipulation of the earlobe does cause some discomfort.       Assessment & Plan:  Acute swimmer's ear of right side Cortisporin otic was called in.  Also discussed the use of alcohol and vinegar combination for preventative purposes.

## 2021-06-08 ENCOUNTER — Ambulatory Visit: Payer: BC Managed Care – PPO | Admitting: Family Medicine

## 2021-06-08 ENCOUNTER — Encounter: Payer: Self-pay | Admitting: Family Medicine

## 2021-06-08 VITALS — BP 126/84 | HR 68 | Temp 96.5°F | Wt 129.2 lb

## 2021-06-08 DIAGNOSIS — J0100 Acute maxillary sinusitis, unspecified: Secondary | ICD-10-CM

## 2021-06-08 MED ORDER — AMOXICILLIN-POT CLAVULANATE 875-125 MG PO TABS: 1.0000 | ORAL_TABLET | Freq: Two times a day (BID) | ORAL | 0 refills | Status: DC

## 2021-06-08 NOTE — Progress Notes (Signed)
   Subjective:    Patient ID: Desiree Yu, female    DOB: 1961-02-13, 61 y.o.   MRN: [id number]  HPI She is here for consult concerning continued difficulty with her right ear.  She now notes postnasal drainage mainly on the right as well as right maxillary discomfort and upper tooth discomfort.  She really has no history of allergies and apparently has been tested in the past which was negative.  She has had trouble with sinus infections for a long period of time.   Review of Systems     Objective:   Physical Exam Alert and in no distress.  Both TMs and canals appear normal.  She is tender over her right maxillary sinus.  Frontal sinuses normal.  Neck is supple without adenopathy       Assessment & Plan:  Acute non-recurrent maxillary sinusitis - Plan: amoxicillin-clavulanate (AUGMENTIN) 875-125 MG tablet I also recommend she use a nasal steroid spray in spite of the fact that she does not have true allergy symptoms to see if this will help quiet things down.  She has seen ENT in the past and apparently is also had CT scanning all of which was negative.

## 2021-06-14 ENCOUNTER — Encounter: Payer: Self-pay | Admitting: Family Medicine

## 2021-06-16 ENCOUNTER — Other Ambulatory Visit: Payer: Self-pay

## 2021-06-16 ENCOUNTER — Telehealth (INDEPENDENT_AMBULATORY_CARE_PROVIDER_SITE_OTHER): Payer: BC Managed Care – PPO | Admitting: Family Medicine

## 2021-06-16 DIAGNOSIS — J0100 Acute maxillary sinusitis, unspecified: Secondary | ICD-10-CM | POA: Diagnosis not present

## 2021-06-16 MED ORDER — AMOXICILLIN-POT CLAVULANATE 875-125 MG PO TABS: 1.0000 | ORAL_TABLET | Freq: Two times a day (BID) | ORAL | 0 refills | Status: DC

## 2021-06-16 MED ORDER — PREDNISONE 10 MG (48) PO TBPK: ORAL_TABLET | ORAL | 0 refills | Status: DC

## 2021-06-16 NOTE — Progress Notes (Signed)
   Subjective:    Patient ID: Desiree Yu, female    DOB: 1961/03/08, 61 y.o.   MRN: [id number]  HPI Documentation for virtual audio and video telecommunications through [facility] encounter: The patient was located at home. 2 patient identifiers used.  The provider was located in the office. The patient did consent to this visit and is aware of possible charges through their insurance for this visit. The other persons participating in this telemedicine service were none. Time spent on call was 5 minutes and in review of previous records >20 minutes total for counseling and coordination of care. This virtual service is not related to other E/M service within previous 7 days.  She continues have difficulty with ear pressure and fullness as well as upper tooth discomfort and pressure.  She has seen ENT in the past and continues have intermittent difficulty with the above symptoms.  She has been on Augmentin for approximately 1 week.  Review of Systems     Objective:   Physical Exam Alert and in no distress otherwise not examined       Assessment & Plan:  Acute non-recurrent maxillary sinusitis - Plan: amoxicillin-clavulanate (AUGMENTIN) 875-125 MG tablet, predniSONE (STERAPRED UNI-PAK 48 TAB) 10 MG (48) TBPK tablet She and I have both expressed frustration over the lack of success with treatment of this.  I will try her on another Dosepak of prednisone as well as continue the Augmentin.  She apparently does have an appoint with a dentist within the next several days.  Perhaps some of her symptoms are actually dental related.

## 2021-06-28 LAB — HM COLONOSCOPY

## 2021-06-30 ENCOUNTER — Other Ambulatory Visit: Payer: Self-pay | Admitting: Family Medicine

## 2021-06-30 DIAGNOSIS — G479 Sleep disorder, unspecified: Secondary | ICD-10-CM

## 2021-06-30 NOTE — Telephone Encounter (Signed)
[name] is requesting to fill pt trazodone. Please advise [facility] 

## 2021-07-07 DIAGNOSIS — Z8719 Personal history of other diseases of the digestive system: Secondary | ICD-10-CM | POA: Insufficient documentation

## 2021-07-20 ENCOUNTER — Other Ambulatory Visit: Payer: Self-pay

## 2021-07-20 ENCOUNTER — Ambulatory Visit (INDEPENDENT_AMBULATORY_CARE_PROVIDER_SITE_OTHER): Payer: BC Managed Care – PPO | Admitting: Family Medicine

## 2021-07-20 ENCOUNTER — Encounter: Payer: Self-pay | Admitting: Family Medicine

## 2021-07-20 VITALS — BP 140/82 | HR 76 | Temp 98.4°F | Wt 131.4 lb

## 2021-07-20 DIAGNOSIS — R070 Pain in throat: Secondary | ICD-10-CM

## 2021-07-20 NOTE — Progress Notes (Signed)
   Subjective:    Patient ID: Desiree Yu, female    DOB: [birth year], 61 y.o.   MRN: [id number]  HPI Here for a recheck.  She continues to complain of right-sided throat discomfort.  She has been seen in the past by ENT most recently by GI and has had CT scans.  None of this has been beneficial.  She has been on multiple antibiotics.  She recently had had difficulty with thrush and was given nystatin.  She is considering going to [facility] to get another opinion concerning all this.   Review of Systems     Objective:   Physical Exam Alert and in no distress. Tympanic membranes and canals are normal except evidence of scarring on the tympanic membranes pharyngeal area is normal. Neck is supple without adenopathy or thyromegaly. Cardiac exam shows a regular sinus rhythm without murmurs or gallops. Lungs are clear to auscultation.        Assessment & Plan:  Throat pain I encouraged her to go ahead and set up an appointment with one of the specialist at [facility].  Explained that they would be able to look at her medical record without difficulty.

## 2021-07-22 ENCOUNTER — Encounter: Payer: Self-pay | Admitting: Family Medicine

## 2021-08-04 ENCOUNTER — Encounter: Payer: Self-pay | Admitting: Family Medicine

## 2021-08-10 ENCOUNTER — Encounter: Payer: Self-pay | Admitting: Family Medicine

## 2021-08-10 ENCOUNTER — Ambulatory Visit: Payer: BC Managed Care – PPO | Admitting: Family Medicine

## 2021-08-10 VITALS — BP 122/80 | HR 66 | Temp 97.5°F | Wt 132.6 lb

## 2021-08-10 DIAGNOSIS — E785 Hyperlipidemia, unspecified: Secondary | ICD-10-CM

## 2021-08-10 DIAGNOSIS — F418 Other specified anxiety disorders: Secondary | ICD-10-CM | POA: Diagnosis not present

## 2021-08-10 DIAGNOSIS — J301 Allergic rhinitis due to pollen: Secondary | ICD-10-CM

## 2021-08-10 DIAGNOSIS — Z8719 Personal history of other diseases of the digestive system: Secondary | ICD-10-CM

## 2021-08-10 MED ORDER — ALPRAZOLAM 0.25 MG PO TABS: 0.2500 mg | ORAL_TABLET | ORAL | 0 refills | Status: DC | PRN

## 2021-08-10 NOTE — Progress Notes (Signed)
   Subjective:    Patient ID: Desiree Yu, female    DOB: 1960/08/05, 61 y.o.   MRN: [id number]  HPI She is here for a med check appointment.  She has stopped her Prilosec and states she notes no difference in her symptoms.  She does have a scheduled appointment at [facility] to have this evaluated there to see if they can come up with any reasoning behind her symptoms.  She does have a history of a hiatus hernia but does not think that is causing any difficulty.  Her allergies seem to be under fairly good control in spite of her symptoms.  She does have a history of hyperlipidemia.  She has a trip planned to [place] and would like Xanax to help with the flight.   Review of Systems     Objective:   Physical Exam Alert and in no distress otherwise not examined       Assessment & Plan:  Non-seasonal allergic rhinitis due to pollen - Plan: CBC with Differential/Platelet, Comprehensive metabolic panel  Hyperlipidemia with target LDL less than 100 - Plan: Lipid panel  H/O hiatal hernia  Situational anxiety - Plan: ALPRAZolam (XANAX) 0.25 MG tablet

## 2021-08-11 LAB — COMPREHENSIVE METABOLIC PANEL
ALT: 20 IU/L (ref 0–32)
AST: 23 IU/L (ref 0–40)
Albumin/Globulin Ratio: 2.6 — ABNORMAL HIGH (ref 1.2–2.2)
Albumin: 5 g/dL — ABNORMAL HIGH (ref 3.8–4.8)
Alkaline Phosphatase: 61 IU/L (ref 44–121)
BUN/Creatinine Ratio: 19 (ref 12–28)
BUN: 12 mg/dL (ref 8–27)
Bilirubin Total: 0.8 mg/dL (ref 0.0–1.2)
CO2: 22 mmol/L (ref 20–29)
Calcium: 9.7 mg/dL (ref 8.7–10.3)
Chloride: 104 mmol/L (ref 96–106)
Creatinine, Ser: 0.62 mg/dL (ref 0.57–1.00)
Globulin, Total: 1.9 g/dL (ref 1.5–4.5)
Glucose: 95 mg/dL (ref 70–99)
Potassium: 4.5 mmol/L (ref 3.5–5.2)
Sodium: 142 mmol/L (ref 134–144)
Total Protein: 6.9 g/dL (ref 6.0–8.5)
eGFR: 101 mL/min/{1.73_m2} (ref >59)

## 2021-08-11 LAB — CBC WITH DIFFERENTIAL/PLATELET
Basophils Absolute: 0.1 10*3/uL (ref 0.0–0.2)
Basos: 1 %
EOS (ABSOLUTE): 0.1 10*3/uL (ref 0.0–0.4)
Eos: 2 %
Hematocrit: 41.3 % (ref 34.0–46.6)
Hemoglobin: 13.9 g/dL (ref 11.1–15.9)
Immature Grans (Abs): 0 10*3/uL (ref 0.0–0.1)
Immature Granulocytes: 0 %
Lymphocytes Absolute: 2.2 10*3/uL (ref 0.7–3.1)
Lymphs: 36 %
MCH: 32.1 pg (ref 26.6–33.0)
MCHC: 33.7 g/dL (ref 31.5–35.7)
MCV: 95 fL (ref 79–97)
Monocytes Absolute: 0.5 10*3/uL (ref 0.1–0.9)
Monocytes: 8 %
Neutrophils Absolute: 3.3 10*3/uL (ref 1.4–7.0)
Neutrophils: 53 %
Platelets: 347 10*3/uL (ref 150–450)
RBC: 4.33 x10E6/uL (ref 3.77–5.28)
RDW: 13 % (ref 11.7–15.4)
WBC: 6.1 10*3/uL (ref 3.4–10.8)

## 2021-08-11 LAB — LIPID PANEL
Chol/HDL Ratio: 2.6 ratio (ref 0.0–4.4)
Cholesterol, Total: 187 mg/dL (ref 100–199)
HDL: 73 mg/dL (ref >39)
LDL Chol Calc (NIH): 107 mg/dL — ABNORMAL HIGH (ref 0–99)
Triglycerides: 31 mg/dL (ref 0–149)
VLDL Cholesterol Cal: 7 mg/dL (ref 5–40)

## 2021-08-17 ENCOUNTER — Other Ambulatory Visit: Payer: Self-pay | Admitting: Family Medicine

## 2021-08-17 DIAGNOSIS — E785 Hyperlipidemia, unspecified: Secondary | ICD-10-CM

## 2021-08-18 ENCOUNTER — Other Ambulatory Visit: Payer: Self-pay

## 2021-09-19 ENCOUNTER — Other Ambulatory Visit: Payer: Self-pay | Admitting: Family Medicine

## 2021-09-19 DIAGNOSIS — G479 Sleep disorder, unspecified: Secondary | ICD-10-CM

## 2021-09-19 NOTE — Telephone Encounter (Signed)
[name] is requesting to fill pt trazodone. Please advise [facility] 

## 2021-09-30 IMAGING — CT CT NECK W/ CM
3 of 4 series · 6 of 14 positions shown, 7 images · IV contrast (iopamidol)
Comparison: None.

CLINICAL DATA: Lymphadenopathy in the neck sensation of fluid in
the right ear

EXAM:
CT NECK WITH CONTRAST
TECHNIQUE: Multidetector CT imaging of the neck was performed using the
standard protocol following the bolus administration of intravenous
contrast.
CONTRAST:  75mL KHHN2Y-YRU IOPAMIDOL (KHHN2Y-YRU) INJECTION 76%

[Series 3: neck · axial · 0.49mm/px · z∈[-157,-77]mm · 2 of 121 slices shown]
[im 41/121  bone]
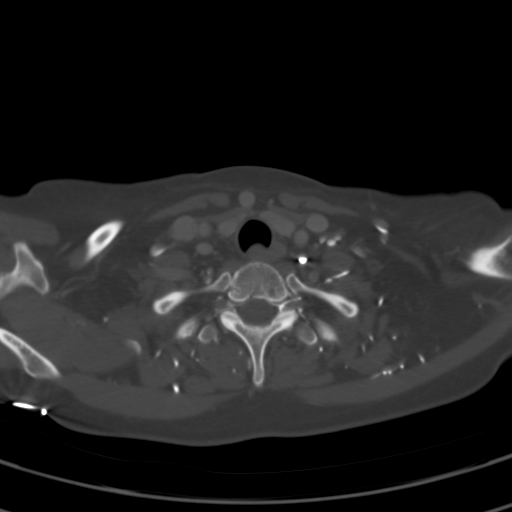
[im 81/121  bone]
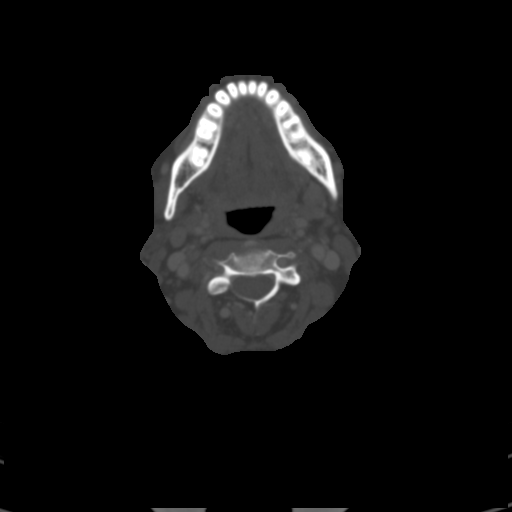

[Series 8: angled axial-oropharynx · axial · 0.39mm/px · z∈[-178,-92]mm · 2 of 133 slices shown, 3 images]
[im 45/133  soft-tissue]
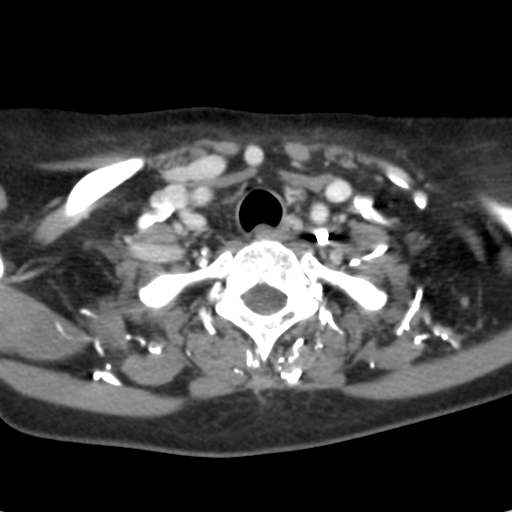
[im 45/133  bone]
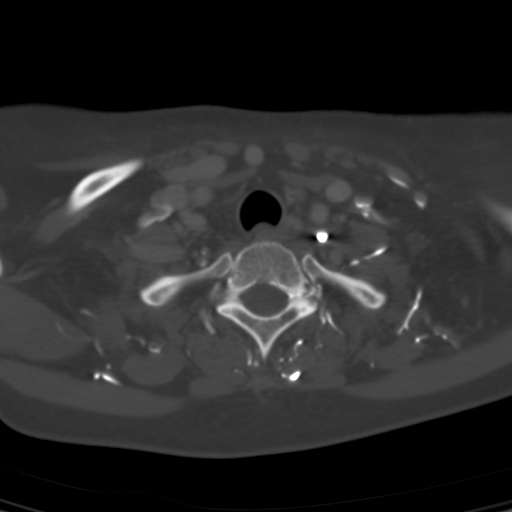
[im 89/133  bone]
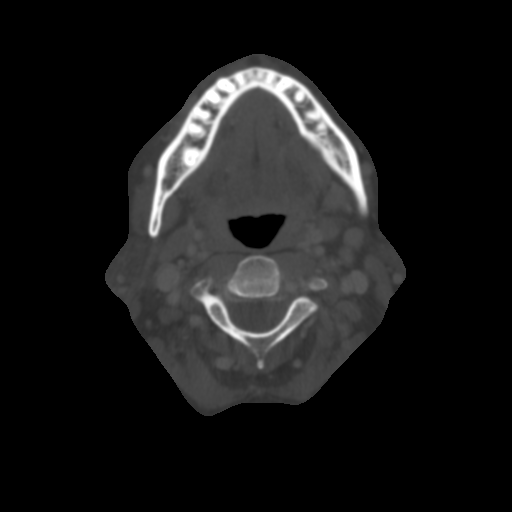

[Series 9: angled (person_name) · axial · 0.39mm/px · z∈[-143,-61]mm · 2 of 124 slices shown]
[im 42/124  bone]
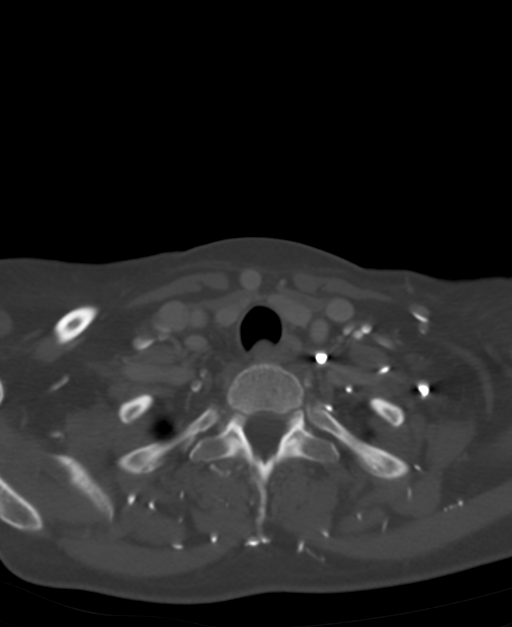
[im 83/124  bone]
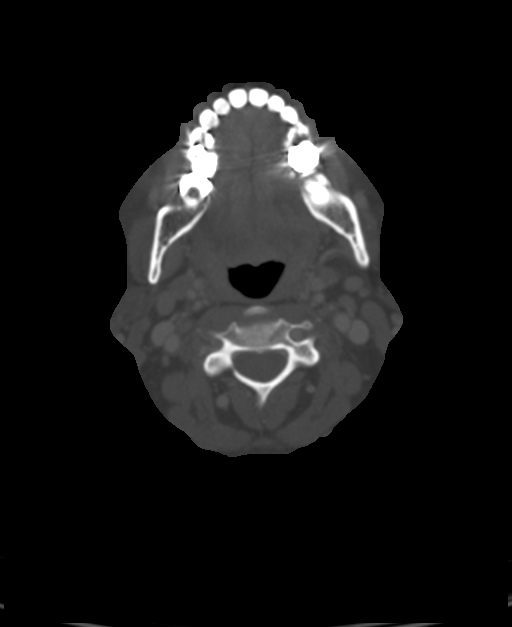

[6 of 14 positions shown; findings below may reference images not displayed]

FINDINGS: Pharynx and larynx: No evidence of mass or swelling.

Salivary glands: No inflammation, mass, or stone.

Thyroid: Normal.

Lymph nodes: Asymmetric larger and rounded left submandibular lymph
nodes measuring up to 12 mm in long axis. No generalized
lymphadenopathy or node heterogeneity. Prominent but not clearly
pathologic posterior triangle nodes with wispy density on the left
likely reflecting lymphatics.

Vascular: Negative

Limited intracranial: Negative

Visualized orbits: Clear

Mastoids and visualized paranasal sinuses: Clear

Skeleton: No acute or destructive change. Mid cervical degenerative
disc narrowing.

Upper chest: Clear apical lungs
IMPRESSION: Mild enlargement of left submandibular lymph nodes. No visible
underlying inflammation or mass throughout the neck.

## 2021-10-11 HISTORY — PX: SKIN BIOPSY: SHX1

## 2021-10-20 ENCOUNTER — Encounter: Payer: Self-pay | Admitting: Family Medicine

## 2021-10-24 ENCOUNTER — Encounter: Payer: Self-pay | Admitting: Family Medicine

## 2021-11-13 ENCOUNTER — Other Ambulatory Visit: Payer: Self-pay | Admitting: Family Medicine

## 2021-11-13 DIAGNOSIS — G479 Sleep disorder, unspecified: Secondary | ICD-10-CM

## 2021-11-14 NOTE — Telephone Encounter (Signed)
[name] is requesting to fill pt trazadone Kh

## 2021-11-17 ENCOUNTER — Ambulatory Visit: Payer: BC Managed Care – PPO | Admitting: Family Medicine

## 2021-12-15 ENCOUNTER — Other Ambulatory Visit: Payer: Self-pay | Admitting: Family Medicine

## 2021-12-15 DIAGNOSIS — I10 Essential (primary) hypertension: Secondary | ICD-10-CM

## 2022-01-06 ENCOUNTER — Telehealth: Payer: Self-pay | Admitting: Family Medicine

## 2022-01-06 ENCOUNTER — Other Ambulatory Visit: Payer: Self-pay | Admitting: Medical

## 2022-01-06 MED ORDER — AMOXICILLIN-POT CLAVULANATE 125-31.25 MG/5ML PO SUSR: 125.0000 mg | Freq: Two times a day (BID) | ORAL | 0 refills | Status: DC

## 2022-01-06 NOTE — Telephone Encounter (Signed)
Pt states she has sinus infection   she has hx of sinus infections Started couple days ago Pressures teeth, eyes, mucous    She states we normally send in rx for her for sinus inf   Wants antibiotic/ Augmentin

## 2022-01-10 ENCOUNTER — Encounter: Payer: Self-pay | Admitting: Family Medicine

## 2022-01-10 ENCOUNTER — Other Ambulatory Visit: Payer: Self-pay | Admitting: Family Medicine

## 2022-01-10 DIAGNOSIS — G479 Sleep disorder, unspecified: Secondary | ICD-10-CM

## 2022-01-10 NOTE — Telephone Encounter (Signed)
[name] is requesting to fill pt trazodone. Please advise [facility] 

## 2022-01-11 ENCOUNTER — Encounter: Payer: Self-pay | Admitting: Internal Medicine

## 2022-01-11 ENCOUNTER — Telehealth: Payer: Self-pay

## 2022-01-11 MED ORDER — AMOXICILLIN-POT CLAVULANATE 875-125 MG PO TABS: 1.0000 | ORAL_TABLET | Freq: Two times a day (BID) | ORAL | 0 refills | Status: DC

## 2022-01-11 NOTE — Telephone Encounter (Signed)
Pt. Called back to let us know that the Augmentin that was sent in was sent in in an oral suspension form and was going to cost 193 dollars. Pt. Stated she usually gets it in a tablet form 875 mg she thought. If you could fix that and get it sent in today pt. Really needs it.

## 2022-02-14 ENCOUNTER — Encounter: Payer: Self-pay | Admitting: Internal Medicine

## 2022-02-27 ENCOUNTER — Encounter: Payer: Self-pay | Admitting: Internal Medicine

## 2022-02-28 ENCOUNTER — Ambulatory Visit: Payer: BC Managed Care – PPO | Admitting: Family Medicine

## 2022-02-28 VITALS — BP 130/88 | HR 76 | Temp 97.8°F | Ht 59.0 in | Wt 132.8 lb

## 2022-02-28 DIAGNOSIS — J0101 Acute recurrent maxillary sinusitis: Secondary | ICD-10-CM

## 2022-02-28 DIAGNOSIS — J301 Allergic rhinitis due to pollen: Secondary | ICD-10-CM

## 2022-02-28 MED ORDER — AMOXICILLIN-POT CLAVULANATE 875-125 MG PO TABS: 1.0000 | ORAL_TABLET | Freq: Two times a day (BID) | ORAL | 1 refills | Status: DC

## 2022-02-28 NOTE — Progress Notes (Signed)
   Subjective:    Patient ID: Desiree Yu, female    DOB: [birth year], 61 y.o.   MRN: [id number]  HPI Planes of a 1 week history of sinus pressure, purulent postnasal drainage as well as purulent rhinorrhea, upper tooth discomfort, pressure behind the eyes but no fever chills sore throat or earache.  She has an extensive history of difficulty with her sinuses and presently is being seen at [facility] by an ENT there.  They are considering doing a Rhinaer procedure.  She does have underlying allergies and has been using Rhinocort regularly and Claritin-D as needed   Review of Systems     Objective:   Physical Exam Alert and in no distress.  TMs are normal.  Nasal mucosa is red.  Tender over frontal and maxillary sinuses.  Throat is clear.  Neck is supple without adenopathy.       Assessment & Plan:  Acute recurrent maxillary sinusitis - Plan: amoxicillin-clavulanate (AUGMENTIN) 875-125 MG tablet  Non-seasonal allergic rhinitis due to pollen I gave her a refill if she is not entirely better when she finishes a 10-day course of Augmentin.  She will continue on her allergy medications.  Strongly encouraged her to get that procedure done to see if that would help. We then discussed mold and its possible role in this.  Encouraged her to discuss this with ENT when she visits again.

## 2022-03-07 ENCOUNTER — Other Ambulatory Visit: Payer: Self-pay | Admitting: Family Medicine

## 2022-03-07 DIAGNOSIS — G479 Sleep disorder, unspecified: Secondary | ICD-10-CM

## 2022-03-07 NOTE — Telephone Encounter (Signed)
Is this okay to refill? 

## 2022-03-09 ENCOUNTER — Other Ambulatory Visit: Payer: Self-pay | Admitting: Family Medicine

## 2022-03-09 DIAGNOSIS — Z1231 Encounter for screening mammogram for malignant neoplasm of breast: Secondary | ICD-10-CM

## 2022-03-15 ENCOUNTER — Encounter: Payer: Self-pay | Admitting: Family Medicine

## 2022-03-15 ENCOUNTER — Other Ambulatory Visit: Payer: Self-pay | Admitting: Family Medicine

## 2022-03-15 DIAGNOSIS — E785 Hyperlipidemia, unspecified: Secondary | ICD-10-CM

## 2022-03-15 DIAGNOSIS — I10 Essential (primary) hypertension: Secondary | ICD-10-CM

## 2022-03-15 MED ORDER — LEVOFLOXACIN 500 MG PO TABS: 500.0000 mg | ORAL_TABLET | Freq: Every day | ORAL | 0 refills | Status: AC

## 2022-05-03 ENCOUNTER — Other Ambulatory Visit: Payer: Self-pay | Admitting: Family Medicine

## 2022-05-03 DIAGNOSIS — G479 Sleep disorder, unspecified: Secondary | ICD-10-CM

## 2022-05-03 NOTE — Telephone Encounter (Signed)
Refill request last apt 02/28/22.

## 2022-05-09 ENCOUNTER — Ambulatory Visit: Payer: BC Managed Care – PPO

## 2022-05-17 NOTE — Progress Notes (Signed)
Formatting of this note is different from the original.  PRIMARY CARE PHYSICIAN:  Wyatt Haste, MD    REFERRING PHYSICIAN:  Self    CC: s/p in office procedure    HPI: The patient is a 62 y.o. old female who underwent in office Rhinaer procedure 04/19/22.  No significant complaints are reported today.  The patient denies significant pain or epistaxis.  Unclear if PND has improved.  She mostly feels PND right side, feels it collects right side of neck/throat.  Continues to experience right maxillary pressure.  Takes Claritin-D.  ROS:  A limited review of systems was obtained from the patient and is documented in the Nursing Progress note for today?s visit. I have reviewed this information and discussed as appropriate with the patient.    PE:    BP (!) 150/86 (BP Location: Right upper arm, Patient Position: Sitting, BP Cuff Size: Adult)   Pulse 83   Temp 36.3 C (97.3 F) (Temporal)   Ht 149.9 cm (4\' 11" )   Wt 59 kg (130 lb)   BMI 26.26 kg/m     General appearance: alert, well appearing, and in no distress    Nose: no gross external deformity, septum midline, mucosa of anterior nasal cavity normal    Face: symmetric with no evidence of inflammation    Procedure Note    Procedure: Rigid Nasal Endoscopy with Debridement    Indication: nasal crusting after in office Rhinaer    Description of Procedure: After topical anesthesia and decongestion, the patient was placed in the sitting position.  The thirty degree telescope was used to examine bilateral nasal cavities. Residual packing, dried blood, and crusting were removed with a combination of suction and forceps. The patient tolerated the procedure well.      Medications: topical tetracaine and oxymetazoline    Findings: Posterior nasal mucosa well healed.  Turbinates well healed.    Complications: None    Assessment: Not sure if gained benefit after Rhinaer procedure.  Introduced concept of laryngeal hypersensitivity.       Plan: Continue saline  irrigations, follow up in 2 months.    Attestation Statement:     I personally performed the service, non-incident to. (WP)     TAMI Miquel Dunn, PA    Antonieta Pert, PA-C  Physician Assistant Certified  Division Rhinology, Endoscopic Skull Base Surgery   Department of [facility] [place]    Issues concerning treatment and diagnosis were discussed with the patient.  There were no barriers to understanding the plan of treatment.  Explanation was well received by patient, who then verbalized understanding.  Updated reconciled list of the patient's medications was reviewed and provided to the patient.     Electronically signed by Broadus John, PA at 05/25/2022 11:47 AM EST

## 2022-05-17 NOTE — Progress Notes (Signed)
Formatting of this note might be different from the original.  Review of Systems   Constitutional:  Negative for chills and fever.   HENT:  Negative for ear pain and sore throat.    Eyes:  Negative for pain and visual disturbance.   Respiratory:  Negative for cough and shortness of breath.    Cardiovascular:  Negative for chest pain and palpitations.   Gastrointestinal:  Negative for abdominal pain and vomiting.   Genitourinary:  Negative for dysuria and hematuria.   Musculoskeletal:  Negative for arthralgias and back pain.   Skin:  Negative for color change and rash.   Neurological:  Negative for seizures and syncope.   All other systems reviewed and are negative.    Electronically signed by Johnney Ou at 05/25/2022 11:47 AM EST

## 2022-05-25 ENCOUNTER — Ambulatory Visit
Admission: RE | Admit: 2022-05-25 | Discharge: 2022-05-25 | Disposition: A | Discharge status: Home / Self Care | Payer: BC Managed Care – PPO | Source: Ambulatory Visit | Attending: Family Medicine | Admitting: Family Medicine

## 2022-05-25 DIAGNOSIS — Z1231 Encounter for screening mammogram for malignant neoplasm of breast: Secondary | ICD-10-CM

## 2022-06-06 ENCOUNTER — Other Ambulatory Visit: Payer: Self-pay | Admitting: Family Medicine

## 2022-06-06 DIAGNOSIS — I10 Essential (primary) hypertension: Secondary | ICD-10-CM

## 2022-06-06 DIAGNOSIS — E785 Hyperlipidemia, unspecified: Secondary | ICD-10-CM

## 2022-06-17 ENCOUNTER — Ambulatory Visit: Admit: 2022-06-17 | Discharge: 2022-06-17 | Payer: BLUE CROSS/BLUE SHIELD

## 2022-06-17 ENCOUNTER — Ambulatory Visit
Admit: 2022-06-17 | Discharge: 2022-06-17 | Payer: BLUE CROSS/BLUE SHIELD | Attending: Student in an Organized Health Care Education/Training Program

## 2022-06-17 DIAGNOSIS — M25521 Pain in right elbow: Secondary | ICD-10-CM

## 2022-06-17 DIAGNOSIS — M79621 Pain in right upper arm: Secondary | ICD-10-CM

## 2022-06-17 DIAGNOSIS — S5001XA Contusion of right elbow, initial encounter: Secondary | ICD-10-CM

## 2022-06-17 MED ORDER — KETOROLAC TROMETHAMINE 30 MG/ML IJ SOLN
30 | Freq: Once | INTRAMUSCULAR | Status: AC
Start: 2022-06-17 — End: 2022-06-17
  Administered 2022-06-17: 22:00:00 30 mg via INTRAMUSCULAR

## 2022-06-17 NOTE — Progress Notes (Signed)
Madison Ellis (DOB:  1961-03-19) is a 62 y.o. female,here for evaluation of the following chief complaint(s):  Other ([place] downtown walking and landed on right arm)          ASSESSMENT/PLAN:  1. Contusion of right elbow, initial encounter  2. Pain of right upper arm  -     XR HUMERUS RIGHT (MIN 2 VIEWS); Future  -     ketorolac (TORADOL) injection 30 mg; 30 mg, IntraMUSCular, ONCE, 1 dose, On Sat 06/17/22 at 1745Do not administer for more than 5 days  3. Right elbow pain  -     XR ELBOW RIGHT (MIN 3 VIEWS); Future  -     ketorolac (TORADOL) injection 30 mg; 30 mg, IntraMUSCular, ONCE, 1 dose, On Sat 06/17/22 at 1745Do not administer for more than 5 days  4. Muscle strain of right upper arm, initial encounter  5. Fall, initial encounter      IM toradol 30 mg administered in office and patient tolerated well with no adverse effects. Patient reports improvement in pain after toradol. Full ROM in right elbow after toradol. X-rays remarkable for degenerative changes of glenohumeral and AC joint as well as the elbow, otherwise unremarkable. No evidence of fracture, dislocation or other acute findings. S/s and exam findings consistent with contusion and muscle strain secondary to injury. Advised may alternate otc tylenol and ibuprofen as directed every 4 hours as needed for pain. Discussed importance of rest, ice and elevation above level of heart. Advised to RTC or f/u with PCP if symptoms worsen or do not improve in 1-2 weeks.         HPI:  Patient presents with c/o right elbow and upper arm pain s/p trip and fall while walking downtown at 1000 today. States impact from fall was on right elbow. States she is right-hand dominant. Unable to fully extend right arm secondary to pain. Denies injuries to other locations. Denies numbness, weakness, tingling, paralysis, color change, temperature change and any other symptoms of right upper extremity.               Review of Systems   Musculoskeletal:  Positive for arthralgias (right  elbow and upper arm).   All other systems reviewed and are negative.        VITALS:  Vitals:    06/17/22 1657   BP: (!) 174/107   Pulse: 85   Resp: 18   Temp: 98.8 F (37.1 C)   SpO2: 98%           MEDICATIONS:  Current Outpatient Medications   Medication Sig Dispense Refill    atorvastatin (LIPITOR) 20 MG tablet TAKE 1 TABLET(20 MG) BY MOUTH DAILY      hydroCHLOROthiazide 12.5 MG tablet TAKE 1 TABLET(12.5 MG) BY MOUTH DAILY      traZODone (DESYREL) 50 MG tablet        No current facility-administered medications for this visit.               There is no problem list on file for this patient.      No past surgical history on file.         ALLERGIES:  Allergies   Allergen Reactions    Erythromycin Base Rash            Physical Exam  Vitals reviewed.   Constitutional:       General: She is awake. She is not in acute distress.     Appearance: Normal appearance. She  is well-developed.   HENT:      Mouth/Throat:      Lips: Pink.      Mouth: Mucous membranes are moist.   Cardiovascular:      Rate and Rhythm: Normal rate.   Pulmonary:      Effort: Pulmonary effort is normal. No respiratory distress.      Breath sounds: Normal breath sounds and air entry.   Musculoskeletal:      Right upper arm: Tenderness (generalized, lateral and medial aspects) present. No swelling, edema, deformity or lacerations.      Right elbow: Swelling present. No deformity, effusion or lacerations. Decreased range of motion (secondary to pain). Tenderness (generalized) present.      Right wrist: Normal. No bony tenderness or snuff box tenderness.      Right hand: Normal. Normal strength. Normal sensation. There is no disruption of two-point discrimination. Normal capillary refill. Normal pulse.      Comments: No point tenderness with palpation of right upper arm and elbow.   Skin:     General: Skin is warm and dry.      Capillary Refill: Capillary refill takes less than 2 seconds.      Findings: No lesion or rash.   Neurological:      Mental  Status: She is alert and oriented to person, place, and time.   Psychiatric:         Behavior: Behavior is cooperative.         30 minutes spent in total, greater than 50% of time spent in counseling.        An electronic signature was used to authenticate this note.    --Hyman Bower, DNP, FNP-C

## 2022-06-17 NOTE — Patient Instructions (Addendum)
IM toradol 30 mg administered in office and patient tolerated well with no adverse effects. Patient reports improvement in pain after toradol. Full ROM in right elbow after toradol. X-rays remarkable for degenerative changes of glenohumeral and AC joint as well as the elbow, otherwise unremarkable. No evidence of fracture, dislocation or other acute findings. S/s and exam findings consistent with contusion and muscle strain secondary to injury. Advised may alternate otc tylenol and ibuprofen as directed every 4 hours as needed for pain. Discussed importance of rest, ice and elevation above level of heart. Advised to RTC or f/u with PCP if symptoms worsen or do not improve in 1-2 weeks.

## 2022-07-01 ENCOUNTER — Other Ambulatory Visit: Payer: Self-pay | Admitting: Family Medicine

## 2022-07-01 DIAGNOSIS — G479 Sleep disorder, unspecified: Secondary | ICD-10-CM

## 2022-07-03 NOTE — Telephone Encounter (Signed)
Refill request last apt 02/28/22 next apt 08/30/22.

## 2022-08-28 ENCOUNTER — Other Ambulatory Visit: Payer: Self-pay | Admitting: Family Medicine

## 2022-08-28 DIAGNOSIS — G479 Sleep disorder, unspecified: Secondary | ICD-10-CM

## 2022-08-29 NOTE — Telephone Encounter (Signed)
Refill request last apt 02/08/22 next apt is tomorrow may want to wait until then to fill.

## 2022-08-30 ENCOUNTER — Encounter: Payer: Self-pay | Admitting: Family Medicine

## 2022-08-30 ENCOUNTER — Ambulatory Visit: Payer: BC Managed Care – PPO | Admitting: Family Medicine

## 2022-08-30 VITALS — BP 146/94 | HR 73 | Temp 97.6°F | Ht 59.75 in | Wt 136.2 lb

## 2022-08-30 DIAGNOSIS — J301 Allergic rhinitis due to pollen: Secondary | ICD-10-CM | POA: Diagnosis not present

## 2022-08-30 DIAGNOSIS — Z8719 Personal history of other diseases of the digestive system: Secondary | ICD-10-CM | POA: Diagnosis not present

## 2022-08-30 DIAGNOSIS — Z0000 Encounter for general adult medical examination without abnormal findings: Secondary | ICD-10-CM

## 2022-08-30 DIAGNOSIS — I10 Essential (primary) hypertension: Secondary | ICD-10-CM

## 2022-08-30 DIAGNOSIS — F418 Other specified anxiety disorders: Secondary | ICD-10-CM

## 2022-08-30 DIAGNOSIS — Z83719 Family history of colon polyps, unspecified: Secondary | ICD-10-CM | POA: Diagnosis not present

## 2022-08-30 DIAGNOSIS — E785 Hyperlipidemia, unspecified: Secondary | ICD-10-CM | POA: Diagnosis not present

## 2022-08-30 DIAGNOSIS — G479 Sleep disorder, unspecified: Secondary | ICD-10-CM

## 2022-08-30 LAB — LIPID PANEL

## 2022-08-30 LAB — COMPREHENSIVE METABOLIC PANEL

## 2022-08-30 MED ORDER — ALPRAZOLAM 0.25 MG PO TABS
0.2500 mg | ORAL_TABLET | ORAL | 0 refills | Status: DC | PRN
Start: 2022-08-30 — End: 2023-08-07

## 2022-08-30 MED ORDER — TRAZODONE HCL 50 MG PO TABS: ORAL_TABLET | ORAL | 3 refills | Status: DC

## 2022-08-30 MED ORDER — ATORVASTATIN CALCIUM 20 MG PO TABS: ORAL_TABLET | ORAL | 3 refills | Status: DC

## 2022-08-30 MED ORDER — HYDROCHLOROTHIAZIDE 12.5 MG PO TABS: ORAL_TABLET | ORAL | 3 refills | Status: DC

## 2022-08-30 NOTE — Progress Notes (Signed)
Complete physical exam  Patient: Desiree Yu   DOB: 1960/12/03   62 y.o. Female  MRN: [id number]  Subjective:    Chief Complaint  Patient presents with   Annual Exam    Fasting. Has concerns about elevated blood pressure readings in the past.     Desiree Yu is a 62 y.o. female who presents today for a complete physical exam. She reports consuming a general diet. Gym/ health club routine includes yoga. She generally feels fairly well. She reports sleeping fairly well. She is getting ready to go on another trip and would like some Xanax to help with that.  She would also like a refill on her sleep meds.  She continues have difficulty with sleep.  She is concerned about her blood pressure and continues on hydrochlorothiazide.  She does have a blood pressure cuff but there is a question of how accurate it is compared to our readings.  She does have a colonoscopy rescheduled for 2028.  She will get Pap and pelvic from her gynecologist.  She does complain of some bilateral hip discomfort.  She does go to the yoga classes which I think are helpful.  Continues on Lipitor.  Life in general is going fairly well for her.  She and her husband are getting ready to go on a trip over to [place] and [place].  Most recent fall risk assessment:    08/30/2022    1:44 PM  Fall Risk   Falls in the past year? 0  Number falls in past yr: 1  Injury with Fall? 0  Risk for fall due to : No Fall Risks  Follow up Falls evaluation completed     Most recent depression screenings:    08/30/2022    1:43 PM 08/10/2021   11:23 AM  PHQ 2/9 Scores  PHQ - 2 Score 0 0  PHQ- 9 Score 0     Vision:Within last year and Dental: Receives regular dental care    Patient Care Team: Ronnald Nian, MD as PCP - General (Family Medicine)   Outpatient Medications Prior to Visit  Medication Sig   Coenzyme Q10 (CO Q 10 PO) Take 1 capsule by mouth.   COLLAGEN PO Take 6 tablets by mouth daily.   fish oil-omega-3 fatty  acids 1000 MG capsule Take 1 g by mouth daily.   loratadine-pseudoephedrine (CLARITIN-D 24-HOUR) 10-240 MG 24 hr tablet Take 1 tablet by mouth daily.   Melatonin 1 MG CAPS    Multiple Vitamins-Minerals (MULTIVITAMIN WITH MINERALS) tablet Take 1 tablet by mouth daily.   OVER THE COUNTER MEDICATION Zyflamend   Sodium Chloride-Xylitol (XLEAR SINUS CARE SPRAY NA)    triamcinolone (NASACORT) 55 MCG/ACT AERO nasal inhaler Place 2 sprays into the nose daily.   TURMERIC CURCUMIN PO Take by mouth.   [DISCONTINUED] ALPRAZolam (XANAX) 0.25 MG tablet Take 1 tablet (0.25 mg total) by mouth as needed for anxiety.   [DISCONTINUED] traZODone (DESYREL) 50 MG tablet TAKE 1 TABLET(50 MG) BY MOUTH AT BEDTIME AS NEEDED FOR SLEEP   [DISCONTINUED] amoxicillin-clavulanate (AUGMENTIN) 875-125 MG tablet Take 1 tablet by mouth 2 (two) times daily.   [DISCONTINUED] Ascorbic Acid (VITAMIN C) 1000 MG tablet Take 1,000 mg by mouth daily.   [DISCONTINUED] atorvastatin (LIPITOR) 20 MG tablet TAKE 1 TABLET(20 MG) BY MOUTH DAILY   [DISCONTINUED] guaiFENesin (MUCINEX) 600 MG 12 hr tablet Take 600 mg by mouth 2 (two) times daily.   [DISCONTINUED] hydrochlorothiazide (HYDRODIURIL) 12.5 MG tablet TAKE 1  TABLET(12.5 MG) BY MOUTH DAILY   [DISCONTINUED] oxymetazoline (AFRIN) 0.05 % nasal spray Place 1 spray into both nostrils 2 (two) times daily.   No facility-administered medications prior to visit.    Review of Systems  All other systems reviewed and are negative.         Objective:     BP (!) 146/94 (BP Location: Right Arm, Cuff Size: Normal)   Pulse 73   Temp 97.6 F (36.4 C) (Oral)   Ht 4' 11.75" (1.518 m)   Wt 136 lb 3.2 oz (61.8 kg)   SpO2 96% Comment: room air  BMI 26.82 kg/m    Physical Exam   Alert and in no distress. Tympanic membranes and canals are normal. Pharyngeal area is normal. Neck is supple without adenopathy or thyromegaly. Cardiac exam shows a regular sinus rhythm without murmurs or gallops.  Lungs are clear to auscultation.      Assessment & Plan:    Routine general medical examination at a health care facility - Plan: CBC with Differential/Platelet, Comprehensive metabolic panel, Lipid panel  Non-seasonal allergic rhinitis due to pollen  Hyperlipidemia with target LDL less than 100 - Plan: atorvastatin (LIPITOR) 20 MG tablet, Lipid panel  H/O hiatal hernia  Family history of colonic polyps  Essential hypertension - Plan: hydrochlorothiazide (HYDRODIURIL) 12.5 MG tablet, CBC with Differential/Platelet, Comprehensive metabolic panel  Situational anxiety - Plan: ALPRAZolam (XANAX) 0.25 MG tablet  Sleep disturbance - Plan: traZODone (DESYREL) 50 MG tablet  Immunization History  Administered Date(s) Administered   Influenza Inj Mdck Quad Pf 01/03/2019   Influenza,inj,Quad PF,6+ Mos 01/24/2013, 03/17/2014, 05/20/2015, 04/05/2016, 03/06/2017, 01/05/2021   Influenza-Unspecified 03/06/2017, 12/24/2019, 02/05/2022   Moderna Covid-19 Vaccine Bivalent Booster 11yrs & up 01/31/2021   Moderna Sars-Covid-2 Vaccination 07/21/2019, 08/19/2019, 03/29/2020   Respiratory Syncytial Virus Vaccine,Recomb Aduvanted(Arexvy) 02/05/2022   Tdap 09/17/2014   Unspecified SARS-COV-2 Vaccination 02/05/2022   Zoster Recombinat (Shingrix) 09/08/2016, 11/16/2016   Zoster, Live 05/20/2015    Health Maintenance  Topic Date Due   HIV Screening  Never done   COVID-19 Vaccine (6 - 2023-24 season) 04/02/2022   INFLUENZA VACCINE  12/07/2022   PAP SMEAR-Modifier  05/10/2023   MAMMOGRAM  05/25/2024   DTaP/Tdap/Td (2 - Td or Tdap) 09/16/2024   COLONOSCOPY (Pts 45-67yrs Insurance coverage will need to be confirmed)  06/28/2026   Hepatitis C Screening  Completed   Zoster Vaccines- Shingrix  Completed   HPV VACCINES  Aged Out      Problem List Items Addressed This Visit     Allergic rhinitis due to pollen   Family history of colonic polyps   H/O hiatal hernia   Hyperlipidemia with target LDL  less than 100   Relevant Medications   atorvastatin (LIPITOR) 20 MG tablet   hydrochlorothiazide (HYDRODIURIL) 12.5 MG tablet   Other Relevant Orders   Lipid panel   Other Visit Diagnoses     Routine general medical examination at a health care facility    -  Primary   Relevant Orders   CBC with Differential/Platelet   Comprehensive metabolic panel   Lipid panel   Essential hypertension       Relevant Medications   atorvastatin (LIPITOR) 20 MG tablet   hydrochlorothiazide (HYDRODIURIL) 12.5 MG tablet   Other Relevant Orders   CBC with Differential/Platelet   Comprehensive metabolic panel   Situational anxiety       Relevant Medications   ALPRAZolam (XANAX) 0.25 MG tablet   traZODone (DESYREL) 50 MG tablet  Sleep disturbance       Relevant Medications   traZODone (DESYREL) 50 MG tablet     Look at the DASH diet .  Check your blood pressure resting after 5 minutes, sitting, arm at heart level recommend she continue doing good stretching exercises and continue on the above medications.  Xanax will be called in as well as trazodone.  She is to check her blood pressure cuff and make sure it is accurate and if the numbers remain above 130/80, she is to return for more aggressive treatment of her blood pressure. Follow-up as needed or in 1 year.   Sharlot Gowda, MD

## 2022-08-30 NOTE — Patient Instructions (Signed)
Look at the DASH diet .  Check your blood pressure resting after 5 minutes, sitting, arm at heart level

## 2022-08-31 LAB — COMPREHENSIVE METABOLIC PANEL
ALT: 20 IU/L (ref 0–32)
AST: 26 IU/L (ref 0–40)
Alkaline Phosphatase: 78 IU/L (ref 44–121)
BUN/Creatinine Ratio: 23 (ref 12–28)
BUN: 13 mg/dL (ref 8–27)
Bilirubin Total: 0.8 mg/dL (ref 0.0–1.2)
Chloride: 98 mmol/L (ref 96–106)
Globulin, Total: 2.7 g/dL (ref 1.5–4.5)
Glucose: 96 mg/dL (ref 70–99)
Sodium: 136 mmol/L (ref 134–144)
Total Protein: 7.5 g/dL (ref 6.0–8.5)

## 2022-08-31 LAB — CBC WITH DIFFERENTIAL/PLATELET
Basophils Absolute: 0.1 10*3/uL (ref 0.0–0.2)
Basos: 1 %
EOS (ABSOLUTE): 0.1 10*3/uL (ref 0.0–0.4)
Eos: 1 %
Hematocrit: 40.4 % (ref 34.0–46.6)
Hemoglobin: 13.1 g/dL (ref 11.1–15.9)
Immature Grans (Abs): 0 10*3/uL (ref 0.0–0.1)
Immature Granulocytes: 0 %
Lymphocytes Absolute: 2 10*3/uL (ref 0.7–3.1)
Lymphs: 29 %
MCH: 29.2 pg (ref 26.6–33.0)
MCHC: 32.4 g/dL (ref 31.5–35.7)
MCV: 90 fL (ref 79–97)
Monocytes Absolute: 0.6 10*3/uL (ref 0.1–0.9)
Monocytes: 8 %
Neutrophils Absolute: 4.3 10*3/uL (ref 1.4–7.0)
Neutrophils: 61 %
Platelets: 406 10*3/uL (ref 150–450)
RBC: 4.49 x10E6/uL (ref 3.77–5.28)
RDW: 13.4 % (ref 11.7–15.4)
WBC: 7 10*3/uL (ref 3.4–10.8)

## 2022-08-31 LAB — LIPID PANEL
HDL: 84 mg/dL (ref >39)
VLDL Cholesterol Cal: 15 mg/dL (ref 5–40)

## 2022-10-10 ENCOUNTER — Telehealth: Payer: Self-pay

## 2022-10-10 ENCOUNTER — Other Ambulatory Visit: Payer: BC Managed Care – PPO

## 2022-10-10 NOTE — Telephone Encounter (Signed)
Pt. Came in for a lab visit today and her BP on her machine was 137/92 and I got 136/90. She also brought in a list of BP readings from home.   112/78 121/82 131/74 134/83 162/94 113/81

## 2022-12-26 ENCOUNTER — Ambulatory Visit: Payer: BC Managed Care – PPO | Admitting: Family Medicine

## 2022-12-26 ENCOUNTER — Encounter: Payer: Self-pay | Admitting: Family Medicine

## 2022-12-26 VITALS — BP 136/88 | HR 76 | Temp 98.3°F | Resp 16 | Wt 135.2 lb

## 2022-12-26 DIAGNOSIS — J0101 Acute recurrent maxillary sinusitis: Secondary | ICD-10-CM | POA: Diagnosis not present

## 2022-12-26 MED ORDER — AMOXICILLIN-POT CLAVULANATE 875-125 MG PO TABS
1.0000 | ORAL_TABLET | Freq: Two times a day (BID) | ORAL | 0 refills | Status: DC
Start: 2022-12-26 — End: 2023-03-08

## 2022-12-26 NOTE — Progress Notes (Signed)
   Subjective:    Patient ID: Desiree Yu, female    DOB: [birth year], 62 y.o.   MRN: [id number]  HPI She has a month-long history of difficulty with nasal congestion, sinus pressure and in the last week headache, difficulty sleeping.  She has been using Claritin-D which does help.  She also has a previous history of trouble with this and has seen 3 different ENT physicians.  She states that Augmentin usually works for this.   Review of Systems     Objective:    Physical Exam Alert and in no distress.  Nasal mucosa slightly erythematous.  Tympanic membranes and canals are normal. Pharyngeal area is normal. Neck is supple without adenopathy or thyromegaly. Cardiac exam shows a regular sinus rhythm without murmurs or gallops. Lungs are clear to auscultation.  Tender to palpation over frontal and maxillary sinuses. The note from [facility] was evaluated.       Assessment & Plan:   Problem List Items Addressed This Visit   None Visit Diagnoses     Acute recurrent maxillary sinusitis    -  Primary   Relevant Medications   amoxicillin-clavulanate (AUGMENTIN) 875-125 MG tablet     She will keep me informed as to how she is doing.  She is to continue on her Claritin-D and Nasacort.  If she has difficulty in the future with this, I recommend that she let me know and I will call in antibiotic in for her as she does seem to have a good handle on this.

## 2023-01-10 ENCOUNTER — Other Ambulatory Visit: Payer: Self-pay | Admitting: Family Medicine

## 2023-01-10 DIAGNOSIS — G479 Sleep disorder, unspecified: Secondary | ICD-10-CM

## 2023-01-10 NOTE — Telephone Encounter (Signed)
Is this okay to refill? 

## 2023-02-05 LAB — HM DEXA SCAN

## 2023-02-07 ENCOUNTER — Telehealth: Payer: Self-pay

## 2023-02-07 ENCOUNTER — Encounter: Payer: Self-pay | Admitting: Family Medicine

## 2023-02-07 NOTE — Telephone Encounter (Signed)
Left pt a voicemail. Need to give her bone density results.

## 2023-02-07 NOTE — Telephone Encounter (Signed)
Spoke with pt

## 2023-02-13 ENCOUNTER — Encounter: Payer: Self-pay | Admitting: Family Medicine

## 2023-02-13 DIAGNOSIS — J301 Allergic rhinitis due to pollen: Secondary | ICD-10-CM

## 2023-03-08 ENCOUNTER — Encounter: Payer: Self-pay | Admitting: Family Medicine

## 2023-03-08 DIAGNOSIS — J0101 Acute recurrent maxillary sinusitis: Secondary | ICD-10-CM

## 2023-03-08 MED ORDER — AMOXICILLIN-POT CLAVULANATE 875-125 MG PO TABS: 1.0000 | ORAL_TABLET | Freq: Two times a day (BID) | ORAL | 0 refills | Status: DC

## 2023-03-20 ENCOUNTER — Encounter: Payer: Self-pay | Admitting: Family Medicine

## 2023-03-22 ENCOUNTER — Encounter: Payer: Self-pay | Admitting: Allergy & Immunology

## 2023-03-22 ENCOUNTER — Ambulatory Visit: Payer: BC Managed Care – PPO | Admitting: Allergy & Immunology

## 2023-03-22 ENCOUNTER — Other Ambulatory Visit: Payer: Self-pay

## 2023-03-22 VITALS — BP 158/92 | HR 82 | Temp 98.5°F | Ht 59.49 in | Wt 138.6 lb

## 2023-03-22 DIAGNOSIS — J310 Chronic rhinitis: Secondary | ICD-10-CM

## 2023-03-22 DIAGNOSIS — B999 Unspecified infectious disease: Secondary | ICD-10-CM | POA: Diagnosis not present

## 2023-03-22 DIAGNOSIS — R030 Elevated blood-pressure reading, without diagnosis of hypertension: Secondary | ICD-10-CM

## 2023-03-22 NOTE — Progress Notes (Signed)
NEW PATIENT  Date of Service/Encounter:  03/22/23  Consult requested by: Ronnald Nian, MD   Assessment:   Chronic rhinitis  Recurrent infections - mostly sinopulmonary in nature  Elevated blood pressure reading in the clinic today  Plan/Recommendations:   1. Chronic rhinitis - Because of insurance stipulations, we cannot do skin testing on the same day as your first visit. - We are all working to fight this, but for now we need to do two separate visits.  - We will know more after we do testing at the next visit.  - The skin testing visit can be squeezed in at your convenience.  - Then we can make a more full plan to address all of your symptoms. - I am going to get an immune workup to make sure that your immune system is working well. - We will likely know some results next week.  - In the meantime, start Xhance one spray per nostril twice daily to see if this helps any more than your current sprays.   2. Return in about 1 week (around 03/29/2023) for ALLERGY TESTING (1-55). You can have the follow up appointment with Dr. Dellis Anes or a Nurse Practicioner (our Nurse Practitioners are excellent and always have Physician oversight!).    This note in its entirety was forwarded to the Provider who requested this consultation.  Subjective:   Desiree Yu is a 62 y.o. female presenting today for evaluation of  Chief Complaint  Patient presents with   Sinus Problem    She is been having chronic sinus problems    Desiree Yu has a history of the following: Patient Active Problem List   Diagnosis Date Noted   Essential hypertension 08/30/2022   H/O hiatal hernia 07/07/2021   Family history of colonic polyps 04/05/2016   Allergic rhinitis due to pollen 11/11/2010   Hyperlipidemia with target LDL less than 100 11/11/2010   TALIPES CAVUS 03/08/2009    History obtained from: chart review and patient.  Discussed the use of AI scribe software for clinical note  transcription with the patient and/or guardian, who gave verbal consent to proceed.  Desiree Yu was referred by Ronnald Nian, MD.     Desiree Yu is a 62 y.o. female presenting for an evaluation of recurrent rhinosinusitis .  Desiree Yu has a history of chronic sinus issues, reports an increase in severity over time. She has sought care from multiple ENT specialists, with the most recent interventions being a RhinAer procedure and a course of antibiotics. The [employer] procedure, which involved manipulation of the turbinoids and some radioactive treatment, initially seemed to alleviate symptoms but the patient reports a resurgence of issues in the past six months.  The patient describes experiencing what she believes to be sinus infections, with symptoms including headaches, particularly at night, and thick, clear mucus. She also reports a deviated septum on the right side, which was not fully addressed during the RhinAer procedure due to the narrowness of the nasal passage. The patient estimates experiencing two to five sinus infections per year, typically requiring antibiotics for resolution.  The patient has tried various over-the-counter and prescription treatments, including Flonase, Nasacort, and XClear, with limited success. She also uses a saline rinse regularly. The most effective treatment reported is Claritin D, but the patient limits use due to its impact on her blood pressure.  The patient notes that her sinus issues seem to worsen in the spring and fall. She has tried antihistamines, but  reports that Zyrtec causes drowsiness and other antihistamines are generally ineffective.  The patient denies any history of asthma, eczema, food allergies, or hives, but does report an allergy to erythromycin. She has no history of other infections requiring hospitalization and no history of ear infections, although she sometimes feels a sensation of drainage into the ears when her sinus congestion is  severe.  The patient has had two sinus CT scans, neither of which revealed any concerning findings. She has not had any surgical interventions for her sinus issues.     Otherwise, there is no history of other atopic diseases, including asthma, food allergies, drug allergies, stinging insect allergies, eczema, urticaria, or contact dermatitis. There is no significant infectious history. Vaccinations are up to date.    Past Medical History: Patient Active Problem List   Diagnosis Date Noted   Essential hypertension 08/30/2022   H/O hiatal hernia 07/07/2021   Family history of colonic polyps 04/05/2016   Allergic rhinitis due to pollen 11/11/2010   Hyperlipidemia with target LDL less than 100 11/11/2010   TALIPES CAVUS 03/08/2009    Medication List:  Allergies as of 03/22/2023       Reactions   E-mycin [erythromycin Base] Rash        Medication List        Accurate as of March 22, 2023 12:05 PM. If you have any questions, ask your nurse or doctor.          ALPRAZolam 0.25 MG tablet Commonly known as: XANAX Take 1 tablet (0.25 mg total) by mouth as needed for anxiety.   amoxicillin-clavulanate 875-125 MG tablet Commonly known as: AUGMENTIN Take 1 tablet by mouth 2 (two) times daily.   atorvastatin 20 MG tablet Commonly known as: LIPITOR TAKE 1 TABLET(20 MG) BY MOUTH DAILY   CO Q 10 PO Take 1 capsule by mouth.   COLLAGEN PO Take 6 tablets by mouth daily.   fish oil-omega-3 fatty acids 1000 MG capsule Take 1 g by mouth daily.   hydrochlorothiazide 12.5 MG tablet Commonly known as: HYDRODIURIL TAKE 1 TABLET(12.5 MG) BY MOUTH DAILY   loratadine-pseudoephedrine 10-240 MG 24 hr tablet Commonly known as: CLARITIN-D 24-hour Take 1 tablet by mouth daily.   Melatonin 1 MG Caps   multivitamin with minerals tablet Take 1 tablet by mouth daily.   OVER THE COUNTER MEDICATION Zyflamend   traZODone 50 MG tablet Commonly known as: DESYREL TAKE 1 TABLET(50 MG)  BY MOUTH AT BEDTIME AS NEEDED FOR SLEEP   triamcinolone 55 MCG/ACT Aero nasal inhaler Commonly known as: NASACORT Place 2 sprays into the nose daily.   Turmeric 500 MG Caps 2 capsules Orally once a day   TURMERIC CURCUMIN PO Take by mouth.   Vitamin C 500 MG Caps as directed Orally   XLEAR SINUS CARE SPRAY NA        Birth History: non-contributory  Developmental History: non-contributory  Past Surgical History: Past Surgical History:  Procedure Laterality Date   COLONOSCOPY  2011   Dr. Ewing Schlein   SKIN BIOPSY Left 02/04/2020   skin shave biopsy, left anterior leg Verruca vulgaris   SKIN BIOPSY Left 10/11/2021   postsurgical scar no neoplasm   skin ex Left 03/05/2020   SKin excision left anterior thorax: post surgical scar, No neoplasm is identiied     Family History: Family History  Problem Relation Age of Onset   Hypertension Mother    Arthritis Father    Stroke Maternal Uncle    Stroke Maternal  Grandfather      Social History: Adeeba lives at home with her husband.  They live in a house that was built in [address].  There is tile with rugs in the main living areas.  They have gas heating and central cooling.  There are no animals inside or outside of the home.  There are no dust mite covers on the bedding.  There is no tobacco exposure.  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   Review of systems otherwise negative other than that mentioned in the HPI.    Objective:   Blood pressure (!) 158/92, pulse 82, temperature 98.5 F (36.9 C), temperature source Temporal, height 4' 11.49" (1.511 m), weight 138 lb 9.6 oz (62.9 kg), SpO2 99%. Body mass index is 27.54 kg/m.     Physical Exam Vitals reviewed.  Constitutional:      Appearance: Normal appearance. She is well-developed.     Comments: Pleasant.  Talkative.  HENT:     Head: Normocephalic and atraumatic.      Right Ear: Tympanic membrane, ear canal and external ear normal. No drainage, swelling or tenderness. Tympanic membrane is not injected, scarred, erythematous, retracted or bulging.     Left Ear: Tympanic membrane, ear canal and external ear normal. No drainage, swelling or tenderness. Tympanic membrane is not injected, scarred, erythematous, retracted or bulging.     Nose: Septal deviation, mucosal edema and rhinorrhea present. No nasal deformity.     Right Turbinates: Enlarged, swollen and pale.     Left Turbinates: Enlarged, swollen and pale.     Right Sinus: No maxillary sinus tenderness or frontal sinus tenderness.     Left Sinus: No maxillary sinus tenderness or frontal sinus tenderness.     Comments: No polyps.    Mouth/Throat:     Lips: Pink.     Mouth: Mucous membranes are moist. Mucous membranes are not pale and not dry.     Pharynx: Uvula midline.     Comments: Cobblestoning in the posterior oropharynx. Eyes:     General:        Right eye: No discharge.        Left eye: No discharge.     Conjunctiva/sclera: Conjunctivae normal.     Right eye: Right conjunctiva is not injected. No chemosis.    Left eye: Left conjunctiva is not injected. No chemosis.    Pupils: Pupils are equal, round, and reactive to light.  Cardiovascular:     Rate and Rhythm: Normal rate and regular rhythm.     Heart sounds: Normal heart sounds.  Pulmonary:     Effort: Pulmonary effort is normal. No tachypnea, accessory muscle usage or respiratory distress.     Breath sounds: Normal breath sounds. No wheezing, rhonchi or rales.  Chest:     Chest wall: No tenderness.  Abdominal:     Tenderness: There is no abdominal tenderness. There is no guarding or rebound.  Lymphadenopathy:     Head:     Right side of head: No submandibular, tonsillar or occipital adenopathy.     Left side of head: No submandibular, tonsillar or occipital adenopathy.     Cervical: No cervical adenopathy.  Skin:    Coloration: Skin  is not pale.     Findings: No abrasion, erythema, petechiae or rash. Rash is not papular, urticarial or vesicular.  Neurological:     Mental Status: She is  alert.  Psychiatric:        Behavior: Behavior is cooperative.      Diagnostic studies: deferred due to insurance stipulations that require a separate visit for testing, but we did get lab work to evaluate her immune system.          Malachi Bonds, MD [facility]

## 2023-03-22 NOTE — Patient Instructions (Addendum)
1. Chronic rhinitis - Because of insurance stipulations, we cannot do skin testing on the same day as your first visit. - We are all working to fight this, but for now we need to do two separate visits.  - We will know more after we do testing at the next visit.  - The skin testing visit can be squeezed in at your convenience.  - Then we can make a more full plan to address all of your symptoms. - I am going to get an immune workup to make sure that your immune system is working well. - We will likely know some results next week.  - In the meantime, start Xhance one spray per nostril twice daily to see if this helps any more than your current sprays.   2. Return in about 1 week (around 03/29/2023) for ALLERGY TESTING (1-55). You can have the follow up appointment with Dr. Dellis Anes or a Nurse Practicioner (our Nurse Practitioners are excellent and always have Physician oversight!).    Please inform us of any Emergency Department visits, hospitalizations, or changes in symptoms. Call us before going to the ED for breathing or allergy symptoms since we might be able to fit you in for a sick visit. Feel free to contact us anytime with any questions, problems, or concerns.  It was a pleasure to see you again today!  Websites that have reliable patient information: 1. American Academy of Asthma, Allergy, and Immunology: www.aaaai.org 2. Food Allergy Research and Education (FARE): foodallergy.org 3. Mothers of Asthmatics: http://www.asthmacommunitynetwork.org 4. American College of Allergy, Asthma, and Immunology: www.acaai.org      "Like" [place] on Facebook and Instagram for our latest updates!      A healthy democracy works best when [employer] participate! Make sure you are registered to vote! If you have moved or changed any of your contact information, you will need to get this updated before voting! Scan the QR codes below to learn more!

## 2023-03-23 ENCOUNTER — Encounter: Payer: Self-pay | Admitting: Cardiovascular Disease

## 2023-03-23 ENCOUNTER — Telehealth: Payer: Self-pay | Admitting: *Deleted

## 2023-03-23 ENCOUNTER — Telehealth: Payer: Self-pay

## 2023-03-23 ENCOUNTER — Ambulatory Visit: Payer: BC Managed Care – PPO | Attending: Cardiovascular Disease | Admitting: Cardiovascular Disease

## 2023-03-23 VITALS — BP 146/46 | HR 71 | Ht 60.0 in | Wt 139.2 lb

## 2023-03-23 DIAGNOSIS — I10 Essential (primary) hypertension: Secondary | ICD-10-CM | POA: Diagnosis not present

## 2023-03-23 DIAGNOSIS — E785 Hyperlipidemia, unspecified: Secondary | ICD-10-CM | POA: Diagnosis not present

## 2023-03-23 DIAGNOSIS — R0981 Nasal congestion: Secondary | ICD-10-CM | POA: Diagnosis not present

## 2023-03-23 DIAGNOSIS — R0683 Snoring: Secondary | ICD-10-CM | POA: Diagnosis not present

## 2023-03-23 DIAGNOSIS — J301 Allergic rhinitis due to pollen: Secondary | ICD-10-CM

## 2023-03-23 MED ORDER — LOSARTAN POTASSIUM 25 MG PO TABS: 25.0000 mg | ORAL_TABLET | Freq: Every day | ORAL | 3 refills | Status: DC

## 2023-03-23 NOTE — Telephone Encounter (Signed)
Late entry from 03/23/23 at 8:20am:  Spoke with referring provider's office (Dr. Cherly Hensen) to advise that records have not yet been received despite multiple requests by our office. Per receptionist, pt was to be referred to hyperlipidemia prevention clinic. Receptionist states that she will send records as soon as possible.   Spoke with pt as she had arrived for her visit at this point. Pt wished to still see Dr. Tresa Endo today rather than wait for an appointment with the hyperlipidemia prevention clinic as she has noted some elevated BPs at home. Dr. Tresa Endo agreed to see pt without having records available. Visit completed.

## 2023-03-23 NOTE — Progress Notes (Signed)
Cardiology Office Note    Date:  04/03/2023   ID:  Desiree Yu, DOB 11/06/1960, MRN [id number]  PCP:  Ronnald Nian, MD  Cardiologist:  Nicki Guadalajara, MD   New cardiology evaluation referred by Dr. Cherly Hensen and sees Dr. Susann Givens for primary care.   History of Present Illness:  Desiree Yu is a 62 y.o. female who is followed by Dr. Susann Givens and has issues with chronic rhinitis, hypertension, hyperlipidemia, and recent blood pressure lability.  She has been on hydrochlorothiazide 12.5 mg for hypertension and has been taking atorvastatin 20 mg with omega-3 fatty acid for approximately 10 years for hyperlipidemia.  Recently, she has had issues with increasing sinus congestion which has affected her sleep.  She typically goes to bed at 10 PM and wakes up at 7.  She is aware of mild snoring.  She states her blood pressure at home typically runs in the 1 30-1 40 range with diastolics in the 80s.  However, recently her blood pressure has become elevated  with systolic in the 150-160 range and diastolics in the 80-90 range.  She has recently been seen by Dr. Dellis Anes of allergy and asthma with blood pressure elevation.  She presents for cardiology evaluation.   Past Medical History:  Diagnosis Date   Allergy    RHINITIS   Anxiety    Hemorrhoids    Hx of colonic polyps    IBS (irritable bowel syndrome)    Right ear pain     Past Surgical History:  Procedure Laterality Date   COLONOSCOPY  2011   Dr. Ewing Schlein   SKIN BIOPSY Left 02/04/2020   skin shave biopsy, left anterior leg Verruca vulgaris   SKIN BIOPSY Left 10/11/2021   postsurgical scar no neoplasm   skin ex Left 03/05/2020   SKin excision left anterior thorax: post surgical scar, No neoplasm is identiied    Current Medications: Outpatient Medications Prior to Visit  Medication Sig Dispense Refill   ALPRAZolam (XANAX) 0.25 MG tablet Take 1 tablet (0.25 mg total) by mouth as needed for anxiety. 20 tablet 0   Ascorbic Acid  (VITAMIN C) 500 MG CAPS as directed Orally     atorvastatin (LIPITOR) 20 MG tablet TAKE 1 TABLET(20 MG) BY MOUTH DAILY 90 tablet 3   Coenzyme Q10 (CO Q 10 PO) Take 1 capsule by mouth.     COLLAGEN PO Take 6 tablets by mouth daily.     fish oil-omega-3 fatty acids 1000 MG capsule Take 1 g by mouth daily.     hydrochlorothiazide (HYDRODIURIL) 12.5 MG tablet TAKE 1 TABLET(12.5 MG) BY MOUTH DAILY 90 tablet 3   loratadine-pseudoephedrine (CLARITIN-D 24-HOUR) 10-240 MG 24 hr tablet Take 1 tablet by mouth daily.     Multiple Vitamins-Minerals (MULTIVITAMIN WITH MINERALS) tablet Take 1 tablet by mouth daily.     OVER THE COUNTER MEDICATION Zyflamend     Sodium Chloride-Xylitol (XLEAR SINUS CARE SPRAY NA)      traZODone (DESYREL) 50 MG tablet TAKE 1 TABLET(50 MG) BY MOUTH AT BEDTIME AS NEEDED FOR SLEEP 30 tablet 1   Turmeric 500 MG CAPS 2 capsules Orally once a day     TURMERIC CURCUMIN PO Take by mouth.     No facility-administered medications prior to visit.     Allergies:   E-mycin [erythromycin base]   Social History   Socioeconomic History   Marital status: Married    Spouse name: Not on file   Number  of children: Not on file   Years of education: Not on file   Highest education level: Not on file  Occupational History   Not on file  Tobacco Use   Smoking status: Never   Smokeless tobacco: Never  Vaping Use   Vaping status: Never Used  Substance and Sexual Activity   Alcohol use: Yes    Alcohol/week: 7.0 standard drinks of alcohol    Types: 7 Glasses of wine per week   Drug use: No   Sexual activity: Yes  Other Topics Concern   Not on file  Social History Narrative   ** Merged History Encounter **       Social Determinants of Health   Financial Resource Strain: Not on file  Food Insecurity: Not on file  Transportation Needs: Not on file  Physical Activity: Not on file  Stress: Not on file  Social Connections: Not on file    Socially she was born in [place].  She is married for 23 years.  She does not have children.  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  There is no tobacco history.  She does drink wine.  She has taken trazodone for sleep issues.  She walks approximately 1 hour and does yoga 3 to 4 days/week  Family History:  The patient's family history includes Arthritis in her father; Hypertension in her mother; Stroke in her maternal grandfather and maternal uncle.  Mother is living and 69 years old and has history of hypertension and heart rate irregularity.  Father died age 56 with dementia and had hyperlipidemia.  She has a sister age 12 with hypertension and hyperlipidemia.  ROS General: Negative; No fevers, chills, or night sweats;  HEENT: Negative; No changes in vision or hearing, sinus congestion, difficulty swallowing Pulmonary: Sinus congestion, allergic rhinitis Cardiovascular: Recent blood pressure elevation;  No chest pain, presyncope, syncope, palpitations GI: Negative; No nausea, vomiting, diarrhea, or abdominal pain GU: Negative; No dysuria, hematuria, or difficulty voiding Musculoskeletal: Negative; no myalgias, joint pain, or weakness Hematologic/Oncology: Negative; no easy bruising, bleeding Endocrine: Negative; no heat/cold intolerance; no diabetes Neuro: Negative; no changes in balance, headaches Skin: Negative; No rashes or skin lesions Psychiatric: Negative; No behavioral problems, depression Sleep: Positive for snoring.  She believes this has been exacerbated by her sinus congestion; no daytime sleepiness, hypersomnolence, bruxism, restless legs, hypnogognic hallucinations, no cataplexy Other comprehensive 14 point system review is negative.   PHYSICAL EXAM:   VS:  BP (!) 146/46 (BP Location: Right Arm, Patient Position: Sitting, Cuff Size: Normal)   Pulse 71   Ht 5' (1.524 m)   Wt 139 lb 3.2 oz (63.1 kg)   SpO2 96%   BMI 27.19 kg/m     Repeat blood pressure by me 150/90  Wt Readings  from Last 3 Encounters:  03/23/23 139 lb 3.2 oz (63.1 kg)  03/22/23 138 lb 9.6 oz (62.9 kg)  12/26/22 135 lb 3.2 oz (61.3 kg)    General: Alert, oriented, no distress.  Skin: normal turgor, no rashes, warm and dry HEENT: Normocephalic, atraumatic. Pupils equal round and reactive to light; sclera anicteric; extraocular muscles intact;  Nose without nasal septal hypertrophy Mouth/Parynx benign; Mallinpatti scale 3 Neck: No JVD, no carotid bruits; normal carotid upstroke Lungs: clear to ausculatation and percussion; no wheezing or rales Chest wall: without tenderness to palpitation Heart: PMI not displaced, RRR, s1 s2 normal, 1/6 systolic murmur, no diastolic murmur, no rubs, gallops, thrills, or heaves Abdomen: soft,  nontender; no hepatosplenomehaly, BS+; abdominal aorta nontender and not dilated by palpation. Back: no CVA tenderness Pulses 2+ Musculoskeletal: full range of motion, normal strength, no joint deformities Extremities: no clubbing cyanosis or edema, Homan's sign negative  Neurologic: grossly nonfocal; Cranial nerves grossly wnl Psychologic: Normal mood and affect   Studies/Labs Reviewed:   EKG Interpretation Date/Time:  Friday March 23 2023 08:47:31 EST Ventricular Rate:  71 PR Interval:  160 QRS Duration:  90 QT Interval:  398 QTC Calculation: 432 R Axis:   53  Text Interpretation: Normal sinus rhythm Nonspecific T wave abnormality No previous ECGs available Confirmed by Nicki Guadalajara ([id number]) on 03/23/2023 9:25:21 AM    Recent Labs:    Latest Ref Rng & Units 08/30/2022    2:44 PM 08/10/2021   11:56 AM 11/11/2020    9:09 PM  BMP  Glucose 70 - 99 mg/dL 96  95  94   BUN 8 - 27 mg/dL 13  12  10    Creatinine 0.57 - 1.00 mg/dL 7.82  9.56  2.13   BUN/Creat Ratio 12 - 28 23  19     Sodium 134 - 144 mmol/L 136  142  140   Potassium 3.5 - 5.2 mmol/L 4.5  4.5  3.6   Chloride 96 - 106 mmol/L 98  104  106   CO2 20 - 29 mmol/L 22  22  22    Calcium 8.7 - 10.3 mg/dL 9.9   9.7  9.6         Latest Ref Rng & Units 08/30/2022    2:44 PM 08/10/2021   11:56 AM 08/11/2020   10:12 AM  Hepatic Function  Total Protein 6.0 - 8.5 g/dL 7.5  6.9  6.9   Albumin 3.9 - 4.9 g/dL 4.8  5.0  4.7   AST 0 - 40 IU/L 26  23  19    ALT 0 - 32 IU/L 20  20  16    Alk Phosphatase 44 - 121 IU/L 78  61  70   Total Bilirubin 0.0 - 1.2 mg/dL 0.8  0.8  0.6        Latest Ref Rng & Units 03/22/2023   10:17 AM 08/30/2022    2:44 PM 08/10/2021   11:56 AM  CBC  WBC 3.4 - 10.8 x10E3/uL 6.8  7.0  6.1   Hemoglobin 11.1 - 15.9 g/dL 08.6  57.8  46.9   Hematocrit 34.0 - 46.6 % 43.6  40.4  41.3   Platelets 150 - 450 x10E3/uL 429  406  347    Lab Results  Component Value Date   MCV 96 03/22/2023   MCV 90 08/30/2022   MCV 95 08/10/2021   No results found for: "TSH" No results found for: "HGBA1C"   BNP No results found for: "BNP"  ProBNP No results found for: "PROBNP"   Lipid Panel     Component Value Date/Time   CHOL 183 08/30/2022 1444   TRIG 85 08/30/2022 1444   HDL 84 08/30/2022 1444   CHOLHDL 2.2 08/30/2022 1444   CHOLHDL 1.9 04/05/2016 1330   VLDL 12 04/05/2016 1330   LDLCALC 84 08/30/2022 1444   LABVLDL 15 08/30/2022 1444     RADIOLOGY: No results found.   Additional studies/ records that were reviewed today include:  Records of Dr. Susann Givens and Dr. Dellis Anes were reviewed.   ASSESSMENT:    1. Essential hypertension   2. Hyperlipidemia with target LDL less than 70   3. Snoring   4. Sinus  congestion   5. Non-seasonal allergic rhinitis due to pollen    PLAN:  Ms. Skiler Garth is a 62 year old female who has a history of hypertension as well as hyperlipidemia.  Recently she has had issues with sinus congestion and allergic rhinitis and has undergone allergy testing with Dr. Lynelle Smoke.  With her increased sinus congestion she has had reduced sleep efficacy and admits to recent snoring.  Previously she has been on hydrochlorothiazide 12.5 mg for  hypertension.  Her blood pressure today is elevated.  I discussed with her most recent hypertensive guidelines with stage I hypertension beginning at 130/80 and stage II hypertension at 140/90.  I also discussed that for every 20/10 increase in blood pressure from above normal there is potentially a doubling of cardiovascular risk.  Presently, I am recommending she undergo a 2D echo Doppler study to assess systolic and diastolic function, wall thickness and valvular architecture.  I have recommended the addition of low-dose losartan 25 mg to her current regimen.  If her blood pressure gets below 110 on this low-dose losartan she can discontinue hydrochlorothiazide.  I am recommending a follow-up being met be obtained in 1 month.  I also discussed the possibility of undergoing future sleep study particularly since untreated sleep apnea can have adverse cardiovascular consequences including effects on hypertension, nocturnal arrhythmias, atrial fibrillation, insulin resistance, increased inflammation, GERD, and potential ischemia related to significant hypoxemia.  At present I have not scheduled her for sleep study pending improvement of her sinus congestion I will see her in 3 months for reassessment or sooner as needed.   Medication Adjustments/Labs and Tests Ordered: Current medicines are reviewed at length with the patient today.  Concerns regarding medicines are outlined above.  Medication changes, Labs and Tests ordered today are listed in the Patient Instructions below. Patient Instructions  Medication Instructions:  Start Losartan 25 mg daily (Keep a log of your BP and if your BP is consistently below 110, then discontinue your hydrochlorothiazide) *If you need a refill on your cardiac medications before your next appointment, please call your pharmacy*   Lab Work: BMP- Please return for Blood Work in One Month. No appointment needed, lab here at the office is open Monday-Friday from 8AM to 4PM  and closed daily for lunch from 12:45-1:45.   If you have labs (blood work) drawn today and your tests are completely normal, you will receive your results only by: MyChart Message (if you have MyChart) OR A paper copy in the mail If you have any lab test that is abnormal or we need to change your treatment, we will call you to review the results.   Testing/Procedures: Your physician has requested that you have an echocardiogram. Echocardiography is a painless test that uses sound waves to create images of your heart. It provides your doctor with information about the size and shape of your heart and how well your heart's chambers and valves are working. This procedure takes approximately one hour. There are no restrictions for this procedure. Please do NOT wear cologne, perfume, aftershave, or lotions (deodorant is allowed). Please arrive 15 minutes prior to your appointment time.  Please note: We ask at that you not bring children with you during ultrasound (echo/ vascular) testing. Due to room size and safety concerns, children are not allowed in the ultrasound rooms during exams. Our front office staff cannot provide observation of children in our lobby area while testing is being conducted. An adult accompanying a patient to their appointment  will only be allowed in the ultrasound room at the discretion of the ultrasound technician under special circumstances. We apologize for any inconvenience.    Follow-Up: At [facility], you and your health needs are our priority.  As part of our continuing mission to provide you with exceptional heart care, we have created designated Provider Care Teams.  These Care Teams include your primary Cardiologist (physician) and Advanced Practice Providers (APPs -  Physician Assistants and Nurse Practitioners) who all work together to provide you with the care you need, when you need it.  We recommend signing up for the patient portal called "MyChart".   Sign up information is provided on this After Visit Summary.  MyChart is used to connect with patients for Virtual Visits (Telemedicine).  Patients are able to view lab/test results, encounter notes, upcoming appointments, etc.  Non-urgent messages can be sent to your provider as well.   To learn more about what you can do with MyChart, go to [email or web address].    Your next appointment:   3 month(s)  Provider:   Dr Tresa Endo    Signed, Nicki Guadalajara, MD  04/03/2023 12:03 PM    [facility] [address], [place], [place]  [address] Phone: [phone]

## 2023-03-23 NOTE — Telephone Encounter (Signed)
Pt called to update her on what her allergist said. States she has not had sinus pressure or headaches since yesterday. Allergist put her on a different steroid, a nasal spray called xhance, that's doing well. She has allergy testing next week and is waiting on the blood work to check immune system.

## 2023-03-23 NOTE — Patient Instructions (Signed)
Medication Instructions:  Start Losartan 25 mg daily (Keep a log of your BP and if your BP is consistently below 110, then discontinue your hydrochlorothiazide) *If you need a refill on your cardiac medications before your next appointment, please call your pharmacy*   Lab Work: BMP- Please return for Blood Work in One Month. No appointment needed, lab here at the office is open Monday-Friday from 8AM to 4PM and closed daily for lunch from 12:45-1:45.   If you have labs (blood work) drawn today and your tests are completely normal, you will receive your results only by: MyChart Message (if you have MyChart) OR A paper copy in the mail If you have any lab test that is abnormal or we need to change your treatment, we will call you to review the results.   Testing/Procedures: Your physician has requested that you have an echocardiogram. Echocardiography is a painless test that uses sound waves to create images of your heart. It provides your doctor with information about the size and shape of your heart and how well your heart's chambers and valves are working. This procedure takes approximately one hour. There are no restrictions for this procedure. Please do NOT wear cologne, perfume, aftershave, or lotions (deodorant is allowed). Please arrive 15 minutes prior to your appointment time.  Please note: We ask at that you not bring children with you during ultrasound (echo/ vascular) testing. Due to room size and safety concerns, children are not allowed in the ultrasound rooms during exams. Our front office staff cannot provide observation of children in our lobby area while testing is being conducted. An adult accompanying a patient to their appointment will only be allowed in the ultrasound room at the discretion of the ultrasound technician under special circumstances. We apologize for any inconvenience.    Follow-Up: At [facility], you and your health needs are our priority.  As  part of our continuing mission to provide you with exceptional heart care, we have created designated Provider Care Teams.  These Care Teams include your primary Cardiologist (physician) and Advanced Practice Providers (APPs -  Physician Assistants and Nurse Practitioners) who all work together to provide you with the care you need, when you need it.  We recommend signing up for the patient portal called "MyChart".  Sign up information is provided on this After Visit Summary.  MyChart is used to connect with patients for Virtual Visits (Telemedicine).  Patients are able to view lab/test results, encounter notes, upcoming appointments, etc.  Non-urgent messages can be sent to your provider as well.   To learn more about what you can do with MyChart, go to [email or web address].    Your next appointment:   3 month(s)  Provider:   Dr Tresa Endo

## 2023-03-29 ENCOUNTER — Encounter: Payer: Self-pay | Admitting: Allergy & Immunology

## 2023-03-29 ENCOUNTER — Ambulatory Visit: Payer: BC Managed Care – PPO | Admitting: Allergy & Immunology

## 2023-03-29 DIAGNOSIS — J310 Chronic rhinitis: Secondary | ICD-10-CM | POA: Diagnosis not present

## 2023-03-29 LAB — STREP PNEUMONIAE 23 SEROTYPES IGG
Pneumo Ab Type 1*: 0.2 ug/mL — ABNORMAL LOW (ref >1.3)
Pneumo Ab Type 12 (12F)*: <0.1 ug/mL — ABNORMAL LOW (ref >1.3)
Pneumo Ab Type 14*: <0.1 ug/mL — ABNORMAL LOW (ref >1.3)
Pneumo Ab Type 17 (17F)*: 1.1 ug/mL — ABNORMAL LOW (ref >1.3)
Pneumo Ab Type 19 (19F)*: 0.3 ug/mL — ABNORMAL LOW (ref >1.3)
Pneumo Ab Type 2*: 0.6 ug/mL — ABNORMAL LOW (ref >1.3)
Pneumo Ab Type 20*: 3.5 ug/mL (ref >1.3)
Pneumo Ab Type 22 (22F)*: <0.1 ug/mL — ABNORMAL LOW (ref >1.3)
Pneumo Ab Type 23 (23F)*: <0.1 ug/mL — ABNORMAL LOW (ref >1.3)
Pneumo Ab Type 26 (6B)*: 0.1 ug/mL — ABNORMAL LOW (ref >1.3)
Pneumo Ab Type 3*: <0.1 ug/mL — ABNORMAL LOW (ref >1.3)
Pneumo Ab Type 34 (10A)*: <0.1 ug/mL — ABNORMAL LOW (ref >1.3)
Pneumo Ab Type 4*: 0.1 ug/mL — ABNORMAL LOW (ref >1.3)
Pneumo Ab Type 43 (11A)*: 0.2 ug/mL — ABNORMAL LOW (ref >1.3)
Pneumo Ab Type 5*: 0.3 ug/mL — ABNORMAL LOW (ref >1.3)
Pneumo Ab Type 51 (7F)*: <0.1 ug/mL — ABNORMAL LOW (ref >1.3)
Pneumo Ab Type 54 (15B)*: 0.5 ug/mL — ABNORMAL LOW (ref >1.3)
Pneumo Ab Type 56 (18C)*: <0.1 ug/mL — ABNORMAL LOW (ref >1.3)
Pneumo Ab Type 57 (19A)*: 0.9 ug/mL — ABNORMAL LOW (ref >1.3)
Pneumo Ab Type 68 (9V)*: 0.1 ug/mL — ABNORMAL LOW (ref >1.3)
Pneumo Ab Type 70 (33F)*: 4.6 ug/mL (ref >1.3)
Pneumo Ab Type 8*: <0.3 ug/mL — ABNORMAL LOW (ref >1.3)
Pneumo Ab Type 9 (9N)*: 0.2 ug/mL — ABNORMAL LOW (ref >1.3)

## 2023-03-29 LAB — CBC WITH DIFFERENTIAL/PLATELET
Basophils Absolute: 0.1 10*3/uL (ref 0.0–0.2)
Basos: 1 %
EOS (ABSOLUTE): 0.1 10*3/uL (ref 0.0–0.4)
Eos: 2 %
Hematocrit: 43.6 % (ref 34.0–46.6)
Hemoglobin: 14.1 g/dL (ref 11.1–15.9)
Immature Grans (Abs): 0 10*3/uL (ref 0.0–0.1)
Immature Granulocytes: 0 %
Lymphocytes Absolute: 2.1 10*3/uL (ref 0.7–3.1)
Lymphs: 30 %
MCH: 31.1 pg (ref 26.6–33.0)
MCHC: 32.3 g/dL (ref 31.5–35.7)
MCV: 96 fL (ref 79–97)
Monocytes Absolute: 0.7 10*3/uL (ref 0.1–0.9)
Monocytes: 10 %
Neutrophils Absolute: 3.9 10*3/uL (ref 1.4–7.0)
Neutrophils: 57 %
Platelets: 429 10*3/uL (ref 150–450)
RBC: 4.53 x10E6/uL (ref 3.77–5.28)
RDW: 12.5 % (ref 11.7–15.4)
WBC: 6.8 10*3/uL (ref 3.4–10.8)

## 2023-03-29 LAB — IGG, IGA, IGM
IgA/Immunoglobulin A, Serum: 159 mg/dL (ref 87–352)
IgG (Immunoglobin G), Serum: 1030 mg/dL (ref 586–1602)
IgM (Immunoglobulin M), Srm: 33 mg/dL (ref 26–217)

## 2023-03-29 LAB — COMPLEMENT, TOTAL: Compl, Total (CH50): >60 U/mL (ref >41)

## 2023-03-29 LAB — DIPHTHERIA / TETANUS ANTIBODY PANEL
Diphtheria Ab: 0.31 [IU]/mL (ref <0.10)
Tetanus Ab, IgG: 1.17 [IU]/mL (ref <0.10)

## 2023-03-29 MED ORDER — BUDESONIDE 0.5 MG/2ML IN SUSP: RESPIRATORY_TRACT | 5 refills | Status: DC

## 2023-03-29 NOTE — Patient Instructions (Addendum)
1. Chronic rhinitis - Testing today showed: grasses - Copy of test results provided.  - These were not very impressive.  - Hold the [place] and start budesonide nasal rinses.  - Recipe shared below.  - You can use an extra dose of the antihistamine, if needed, for breakthrough symptoms.  - Consider nasal saline rinses 1-2 times daily to remove allergens from the nasal cavities as well as help with mucous clearance (this is especially helpful to do before the nasal sprays are given) - Get the pneumonia vaccine (I prefer Prevnar-20 since this is longer lasting).  - IPRATROPIUM is good for drying the nasal cavity.   2. Return if symptoms worsen or fail to improve. Keep in touch!   Please inform us of any Emergency Department visits, hospitalizations, or changes in symptoms. Call us before going to the ED for breathing or allergy symptoms since we might be able to fit you in for a sick visit. Feel free to contact us anytime with any questions, problems, or concerns.  It was a pleasure to see you again today!  Websites that have reliable patient information: 1. American Academy of Asthma, Allergy, and Immunology: www.aaaai.org 2. Food Allergy Research and Education (FARE): foodallergy.org 3. Mothers of Asthmatics: http://www.asthmacommunitynetwork.org 4. American College of Allergy, Asthma, and Immunology: www.acaai.org      "Like" [place] on Facebook and Instagram for our latest updates!      A healthy democracy works best when [employer] participate! Make sure you are registered to vote! If you have moved or changed any of your contact information, you will need to get this updated before voting! Scan the QR codes below to learn more!       Airborne Adult Perc - 03/29/23 1428     Time Antigen Placed 1415    Allergen Manufacturer Waynette Buttery    Location Back    Number of Test 55    Panel 1 Select    1. Control-Buffer 50% Glycerol Negative    2. Control-Histamine 2+    3. Bahia Negative     4. [place] Southern Territ[place] Negative    5. [place] Negative    6. [place] Blue Negative    7. Meadow Fescue Negative    8. Perennial Rye Negative    9. Timothy Negative    10. Ragweed Mix Negative    11. Cocklebur Negative    12. Plantain,  English Negative    13. Baccharis Negative    14. Dog Fennel Negative    15. Russian Thistle Negative    16. Lamb's Quarters Negative    17. Sheep Sorrell Negative    18. Rough Pigweed Negative    19. Marsh Elder, Rough Negative    20. Mugwort, Common Negative    21. Box, Elder Negative    22. Cedar, red Negative    23. Sweet Gum Negative    24. Pecan Pollen Negative    25. Pine Mix Negative    26. Walnut, Black Pollen Negative    27. Red Mulberry Negative    28. Ash Mix Negative    29. Birch Mix Negative    30. Beech American Negative    31. Cottonwood, Guinea-Bissau Negative    32. Hickory, White Negative    33. Maple Mix Negative    34. Oak, Guinea-[place]iss[place] Mix Negative    35. Sycamore Eastern Negative    36. Alternaria Alternata Negative    37. Cladosporium Herbarum Negative    38. Aspergillus Mix Negative  39. Penicillium Mix Negative    40. Bipolaris Sorokiniana (Helminthosporium) Negative    41. Drechslera Spicifera (Curvularia) Negative    42. Mucor Plumbeus Negative    43. Fusarium Moniliforme Negative    44. Aureobasidium Pullulans (pullulara) Negative    45. Rhizopus Oryzae Negative    46. Botrytis Cinera Negative    47. Epicoccum Nigrum Negative    48. Phoma Betae Negative    49. Dust Mite Mix Negative    50. Cat Hair 10,000 BAU/ml Negative    51.  Dog Epithelia Negative    52. Mixed Feathers Negative    53. Horse Epithelia Negative    54. Cockroach, German Negative    55. Tobacco Leaf Negative             Intradermal - 03/29/23 1400     Time Antigen Placed 1439    Allergen Manufacturer Waynette Buttery    Location Back    Number of Test 16    Control Negative    B[place]ia 1+    [place] Territ[place] 1+    [place] Negative    7 Grass Negative    Ragweed  Mix Negative    Weed Mix Negative    Tree Mix Negative    Mold 1 Negative    Mold 2 Negative    Mold 3 Negative    Mold 4 Negative    Mite Mix Negative    Cat Negative    Dog Negative    Cockroach Negative             Reducing Pollen Exposure  The American Academy of Allergy, Asthma and Immunology suggests the following steps to reduce your exposure to pollen during allergy seasons.    Do not hang sheets or clothing out to dry; pollen may collect on these items. Do not mow lawns or spend time around freshly cut grass; mowing stirs up pollen. Keep windows closed at night.  Keep car windows closed while driving. Minimize morning activities outdoors, a time when pollen counts are usually at their highest. Stay indoors as much as possible when pollen counts or humidity is high and on windy days when pollen tends to remain in the air longer. Use air conditioning when possible.  Many air conditioners have filters that trap the pollen spores. Use a HEPA room air filter to remove pollen form the indoor air you breathe.     Budesonide (Pulmicort) + Saline Irrigation/Rinse   Budesonide (Pulmicort) is an anti-inflammatory steroid medication used to decrease nasal and sinus inflammation. It is dispensed in liquid form in a vial. Although it is manufactured for use with a nebulizer, we intend for you to use it with the NeilMed Sinus Rinse bottle (preferred) or a Neti pot.    Instructions:  1) Make 240 mL of saline in the NeilMed bottle using the salt packets or your own saline recipe (see separate handout).  2) Add the entire 2 mL vial of liquid Budesonide (Pulmicort) to the rinse bottle and mix together.  3) While in the shower or over the sink, tilt your head forward to a comfortable level. Put the tip of the sinus rinse bottle in your nostril and aim it towards the crown or top of your head. Gently squeeze the bottle to flush out your nose. The fluid will circulate in and out of your  sinus cavities, coming back out from either nostril or through your mouth. Try not to swallow large quantities and spit it out instead.  4) Perform  Budesonide (Pulmicort) + Saline irrigations 2 times daily.

## 2023-03-29 NOTE — Progress Notes (Signed)
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  She has already been to 3 otolaryngologist without much improvement.  We do have to finish her immune workup with a Prevnar 20, which I recommended that she obtain from her PCP.  We can do repeat labs 4 to 6 weeks after the vaccine is given.  Her immune workup was otherwise unremarkable.  We are going to start her on budesonide nasal rinses to see if this helps at all.  She already does NeilMed rinses with saline daily anyway.  Hopefully this will be more affordable than the [place].  Plan/Recommendations:   1. Chronic rhinitis - Testing today showed: grasses - Copy of test results provided.  - These were not very impressive.  - Hold the [place] and start budesonide nasal rinses.  - Recipe shared below.  - You can use an extra dose of the antihistamine, if needed, for breakthrough symptoms.  - Consider nasal saline rinses 1-2 times daily to remove allergens from the nasal cavities as well as help with mucous clearance (this is especially helpful to do before the nasal sprays are given) - Get the pneumonia vaccine (I prefer Prevnar-20 since this is longer lasting).  - IPRATROPIUM is good for drying the nasal cavity.   2. Return if symptoms worsen or fail to improve. Keep in touch!   Subjective:   Desiree Yu is a 62 y.o. female presenting today for follow up of No chief complaint on file.   Desiree Yu has a history of the following: Patient Active Problem List   Diagnosis Date Noted   Essential hypertension 08/30/2022   H/O hiatal hernia 07/07/2021   Family history of colonic polyps 04/05/2016   Allergic rhinitis due to pollen 11/11/2010   Hyperlipidemia  with target LDL less than 100 11/11/2010   TALIPES CAVUS 03/08/2009    History obtained from: chart review and patient.  Discussed the use of AI scribe software for clinical note transcription with the patient and/or guardian, who gave verbal consent to proceed.  Desiree Yu is a 62 y.o. female presenting for skin testing. She was last seen on November 14th. We could not do testing because her insurance company does not cover testing on the same day as a New Patient visit. She has been off of all antihistamines 3 days in anticipation of the testing.  We started her on Xhance.  Otherwise, there have been no changes to her past medical history, surgical history, family history, or social history.    Review of systems otherwise negative other than that mentioned in the HPI.    Objective:   There were no vitals taken for this visit. There is no height or weight on file to calculate BMI.    Physical exam deferred since this was a skin testing appointment only.   Diagnostic studies:    Allergy Studies:     Airborne Adult Perc - 03/29/23 1428     Time Antigen Placed 1415    Allergen Manufacturer Waynette Buttery    Location Back    Number of Test 55    Panel 1 Select    1. Control-Buffer 50% Glycerol Negative    2. Control-Histamine 2+    3. Bahia Negative    4. French Southern Territories  Negative    5. Johnson Negative    6. Kentucky Blue Negative    7. Meadow Fescue Negative    8. Perennial Rye Negative    9. Timothy Negative    10. Ragweed Mix Negative    11. Cocklebur Negative    12. Plantain,  English Negative    13. Baccharis Negative    14. Dog Fennel Negative    15. Russian Thistle Negative    16. Lamb's Quarters Negative    17. Sheep Sorrell Negative    18. Rough Pigweed Negative    19. Marsh Elder, Rough Negative    20. Mugwort, Common Negative    21. Box, Elder Negative    22. Cedar, red Negative    23. Sweet Gum Negative    24. Pecan Pollen Negative    25. Pine Mix Negative    26.  Walnut, Black Pollen Negative    27. Red Mulberry Negative    28. Ash Mix Negative    29. Birch Mix Negative    30. Beech American Negative    31. Cottonwood, Guinea-Bissau Negative    32. Hickory, White Negative    33. Maple Mix Negative    34. Oak, Guinea-[place] Mix Negative    35. Sycamore Eastern Negative    36. Alternaria Alternata Negative    37. Cladosporium Herbarum Negative    38. Aspergillus Mix Negative    39. Penicillium Mix Negative    40. Bipolaris Sorokiniana (Helminthosporium) Negative    41. Drechslera Spicifera (Curvularia) Negative    42. Mucor Plumbeus Negative    43. Fusarium Moniliforme Negative    44. Aureobasidium Pullulans (pullulara) Negative    45. Rhizopus Oryzae Negative    46. Botrytis Cinera Negative    47. Epicoccum Nigrum Negative    48. Phoma Betae Negative    49. Dust Mite Mix Negative    50. Cat Hair 10,000 BAU/ml Negative    51.  Dog Epithelia Negative    52. Mixed Feathers Negative    53. Horse Epithelia Negative    54. Cockroach, German Negative    55. Tobacco Leaf Negative             Intradermal - 03/29/23 1400     Time Antigen Placed 1439    Allergen Manufacturer Waynette Buttery    Location Back    Number of Test 16    Control Negative    Bahia 1+    French Southern Territ[place] 1+    Johnson Negative    7 Grass Negative    Ragweed Mix Negative    Weed Mix Negative    Tree Mix Negative    Mold 1 Negative    Mold 2 Negative    Mold 3 Negative    Mold 4 Negative    Mite Mix Negative    Cat Negative    Dog Negative    Cockroach Negative             Allergy testing results were read and interpreted by myself, documented by clinical staff.      Malachi Bonds, MD  [facility]

## 2023-04-03 ENCOUNTER — Encounter: Payer: Self-pay | Admitting: Cardiovascular Disease

## 2023-04-25 ENCOUNTER — Ambulatory Visit: Payer: BC Managed Care – PPO | Admitting: Family Medicine

## 2023-04-25 ENCOUNTER — Encounter: Payer: Self-pay | Admitting: Family Medicine

## 2023-04-25 VITALS — BP 134/82 | HR 83 | Temp 98.2°F | Wt 140.0 lb

## 2023-04-25 DIAGNOSIS — J0101 Acute recurrent maxillary sinusitis: Secondary | ICD-10-CM

## 2023-04-25 MED ORDER — PREDNISONE 10 MG (48) PO TBPK: ORAL_TABLET | ORAL | 0 refills | Status: DC

## 2023-04-25 MED ORDER — LEVOFLOXACIN 500 MG PO TABS: 500.0000 mg | ORAL_TABLET | Freq: Every day | ORAL | 0 refills | Status: AC

## 2023-04-25 NOTE — Progress Notes (Signed)
   Subjective:    Patient ID: Desiree Yu, female    DOB: 05/18/1960, 62 y.o.   MRN: [id number]  HPI Approximately 10 days ago she was seen in an urgent care center and treated for a sinus infection.  She was given a shot of steroid as well as Augmentin.  She continued had difficulty with mucopurulent discharge with some blood in it.  She is still having difficulty with that as of today.  She has had previous difficulty with this over the last several years and is seeing an ENT here in town as well as at [facility], all of the evaluations have been nondiagnostic.  She continues have difficulty with this especially with the right   Review of Systems     Objective:    Physical Exam Alert and in no distress. Tympanic membranes and canals are normal. Pharyngeal area is normal. Neck is supple without adenopathy or thyromegaly. Cardiac exam shows a regular sinus rhythm without murmurs or gallops. Lungs are clear to auscultation. Tender to palpation over the right maxillary sinus.       Assessment & Plan:  Acute recurrent maxillary sinusitis - Plan: Pneumococcal conjugate vaccine 20-valent (Prevnar 20), predniSONE (STERAPRED UNI-PAK 48 TAB) 10 MG (48) TBPK tablet, levofloxacin (LEVAQUIN) 500 MG tablet, Ambulatory referral to ENT I will give her a longer course of steroid preparation and place her on Levaquin since the Augmentin does not seem to be working.  Will also refer to ENT to see if they have any other thoughts on this.

## 2023-04-26 LAB — BASIC METABOLIC PANEL
BUN/Creatinine Ratio: 29 — ABNORMAL HIGH (ref 12–28)
BUN: 17 mg/dL (ref 8–27)
CO2: 19 mmol/L — ABNORMAL LOW (ref 20–29)
Calcium: 9.2 mg/dL (ref 8.7–10.3)
Chloride: 99 mmol/L (ref 96–106)
Creatinine, Ser: 0.59 mg/dL (ref 0.57–1.00)
Glucose: 109 mg/dL — ABNORMAL HIGH (ref 70–99)
Potassium: 4.2 mmol/L (ref 3.5–5.2)
Sodium: 136 mmol/L (ref 134–144)
eGFR: 101 mL/min/{1.73_m2} (ref >59)

## 2023-04-30 ENCOUNTER — Encounter: Payer: Self-pay | Admitting: Cardiovascular Disease

## 2023-04-30 ENCOUNTER — Ambulatory Visit (HOSPITAL_COMMUNITY): Payer: BC Managed Care – PPO | Attending: Cardiovascular Disease

## 2023-04-30 DIAGNOSIS — I10 Essential (primary) hypertension: Secondary | ICD-10-CM | POA: Insufficient documentation

## 2023-04-30 LAB — ECHOCARDIOGRAM COMPLETE: S' Lateral: 2.36 cm

## 2023-05-08 ENCOUNTER — Encounter: Payer: Self-pay | Admitting: Family Medicine

## 2023-06-05 ENCOUNTER — Telehealth (INDEPENDENT_AMBULATORY_CARE_PROVIDER_SITE_OTHER): Payer: Self-pay | Admitting: Otolaryngology

## 2023-06-05 NOTE — Telephone Encounter (Signed)
Confirmed appt & location [id number] afm

## 2023-06-06 ENCOUNTER — Encounter (INDEPENDENT_AMBULATORY_CARE_PROVIDER_SITE_OTHER): Payer: Self-pay

## 2023-06-06 ENCOUNTER — Ambulatory Visit (INDEPENDENT_AMBULATORY_CARE_PROVIDER_SITE_OTHER): Payer: 59

## 2023-06-06 VITALS — BP 124/80 | HR 110 | Ht 60.0 in | Wt 135.0 lb

## 2023-06-06 DIAGNOSIS — J343 Hypertrophy of nasal turbinates: Secondary | ICD-10-CM

## 2023-06-06 DIAGNOSIS — J310 Chronic rhinitis: Secondary | ICD-10-CM

## 2023-06-06 DIAGNOSIS — R519 Headache, unspecified: Secondary | ICD-10-CM | POA: Diagnosis not present

## 2023-06-06 DIAGNOSIS — R0981 Nasal congestion: Secondary | ICD-10-CM | POA: Diagnosis not present

## 2023-06-07 ENCOUNTER — Encounter: Payer: Self-pay | Admitting: Neurology

## 2023-06-07 DIAGNOSIS — J310 Chronic rhinitis: Secondary | ICD-10-CM | POA: Insufficient documentation

## 2023-06-07 DIAGNOSIS — R519 Headache, unspecified: Secondary | ICD-10-CM | POA: Insufficient documentation

## 2023-06-07 DIAGNOSIS — J343 Hypertrophy of nasal turbinates: Secondary | ICD-10-CM | POA: Insufficient documentation

## 2023-06-07 NOTE — Progress Notes (Signed)
Patient ID: Desiree Yu, female   DOB: 04-23-1961, 63 y.o.   MRN: [id number]  CC: Recurrent sinusitis, recurrent headaches and facial pain  HPI:  Desiree Yu is a 63 y.o. female who presents today complaining of recurrent sinusitis and recurrent headaches and facial pain.  She has been symptomatic for more than 3 years.  She was evaluated by 3 different ENT physicians.  Her symptoms include facial pain and pressure, nasal drainage, and nasal congestion.  She typically has 4-5 episodes of sinusitis per year.  She was treated with multiple courses of antibiotics and systemic/topical steroids.  Her last antibiotic was in December 2024.  Her CT scans in 2022 and 2023 were all negative for significant sinusitis.  She also underwent cryoablation therapy at [facility] to treat her chronic postnasal drainage.  However, she continues to be symptomatic.  Past Medical History:  Diagnosis Date   Allergy    RHINITIS   Anxiety    Hemorrhoids    Hx of colonic polyps    IBS (irritable bowel syndrome)    Right ear pain     Past Surgical History:  Procedure Laterality Date   COLONOSCOPY  2011   Dr. Ewing Schlein   SKIN BIOPSY Left 02/04/2020   skin shave biopsy, left anterior leg Verruca vulgaris   SKIN BIOPSY Left 10/11/2021   postsurgical scar no neoplasm   skin ex Left 03/05/2020   SKin excision left anterior thorax: post surgical scar, No neoplasm is identiied    Family History  Problem Relation Age of Onset   Hypertension Mother    Arthritis Father    Stroke Maternal Uncle    Stroke Maternal Grandfather     Social History:  reports that she has never smoked. She has never used smokeless tobacco. She reports current alcohol use of about 7.0 standard drinks of alcohol per week. She reports that she does not use drugs.  Allergies:  Allergies  Allergen Reactions   E-Mycin [Erythromycin Base] Rash    Prior to Admission medications   Medication Sig Start Date End Date Taking? Authorizing Provider   Ascorbic Acid (VITAMIN C) 500 MG CAPS as directed Orally   Yes [provider]  atorvastatin (LIPITOR) 20 MG tablet TAKE 1 TABLET(20 MG) BY MOUTH DAILY 08/30/22  Yes Ronnald Nian, MD  budesonide (PULMICORT) 0.5 MG/2ML nebulizer solution Mix one vial into the NeilMed bottle and instill through each nostril once daily. 03/29/23  Yes Alfonse Spruce, MD  Coenzyme Q10 (CO Q 10 PO) Take 1 capsule by mouth.   Yes [provider]  COLLAGEN PO Take 6 tablets by mouth daily.   Yes [provider]  fish oil-omega-3 fatty acids 1000 MG capsule Take 1 g by mouth daily.   Yes [provider]  hydrochlorothiazide (HYDRODIURIL) 12.5 MG tablet TAKE 1 TABLET(12.5 MG) BY MOUTH DAILY 08/30/22  Yes Ronnald Nian, MD  losartan (COZAAR) 25 MG tablet Take 1 tablet (25 mg total) by mouth daily. 03/23/23  Yes Lennette Bihari, MD  Multiple Vitamins-Minerals (MULTIVITAMIN WITH MINERALS) tablet Take 1 tablet by mouth daily.   Yes [provider]  traZODone (DESYREL) 50 MG tablet TAKE 1 TABLET(50 MG) BY MOUTH AT BEDTIME AS NEEDED FOR SLEEP 01/10/23  Yes Ronnald Nian, MD  Turmeric 500 MG CAPS 2 capsules Orally once a day   Yes [provider]  ALPRAZolam (XANAX) 0.25 MG tablet Take 1 tablet (0.25 mg total) by mouth as needed for anxiety. Patient  not taking: Reported on 04/25/2023 08/30/22   Ronnald Nian, MD  loratadine-pseudoephedrine (CLARITIN-D 24-HOUR) 10-240 MG 24 hr tablet Take 1 tablet by mouth daily.    [provider]  OVER THE COUNTER MEDICATION Zyflamend Patient not taking: Reported on 06/06/2023    [provider]  predniSONE (STERAPRED UNI-PAK 48 TAB) 10 MG (48) TBPK tablet Take as per manufacturer's recommendations Patient not taking: Reported on 06/06/2023 04/25/23   Ronnald Nian, MD  Sodium Chloride-Xylitol Daphene Calamity SINUS CARE SPRAY NA)  06/23/20   [provider]  TURMERIC CURCUMIN PO Take by mouth. Patient not taking:  Reported on 06/06/2023    [provider]    Blood pressure 124/80, pulse (!) 110, height 5' (1.524 m), weight 135 lb (61.2 kg), SpO2 92%. Exam: General: Communicates without difficulty, well nourished, no acute distress. Head: Normocephalic, no evidence injury, no tenderness, facial buttresses intact without stepoff. Face/sinus: No tenderness to palpation and percussion. Facial movement is normal and symmetric. Eyes: PERRL, EOMI. No scleral icterus, conjunctivae clear. Neuro: CN II exam reveals vision grossly intact.  No nystagmus at any point of gaze. Ears: Auricles well formed without lesions.  Ear canals are intact without mass or lesion.  No erythema or edema is appreciated.  The TMs are intact without fluid. Nose: External evaluation reveals normal support and skin without lesions.  Dorsum is intact.  Anterior rhinoscopy reveals congested mucosa over anterior aspect of inferior turbinates and intact septum.  No purulence noted. Oral:  Oral cavity and oropharynx are intact, symmetric, without erythema or edema.  Mucosa is moist without lesions. Neck: Full range of motion without pain.  There is no significant lymphadenopathy.  No masses palpable.  Thyroid bed within normal limits to palpation.  Parotid glands and submandibular glands equal bilaterally without mass.  Trachea is midline. Neuro:  CN 2-12 grossly intact.   Procedure:  Flexible Nasal Endoscopy: Description: Risks, benefits, and alternatives of flexible endoscopy were explained to the patient.  Specific mention was made of the risk of throat numbness with difficulty swallowing, possible bleeding from the nose and mouth, and pain from the procedure.  The patient gave oral consent to proceed.  The flexible scope was inserted into the right nasal cavity.  Endoscopy of the interior nasal cavity, superior, inferior, and middle meatus was performed. The sphenoid-ethmoid recess was examined. Edematous mucosa was noted.  No polyp, mass, or  lesion was appreciated. Olfactory cleft was clear.  Nasopharynx was clear.  Turbinates were hypertrophied but without mass.  The procedure was repeated on the contralateral side with similar findings.  The patient tolerated the procedure well.   Assessment: 1.  Chronic rhinitis with nasal mucosal congestion and bilateral inferior turbinate hypertrophy. 2.  No polyps, mass, lesion, or purulent drainage is noted today.  Her previous CT scans were negative for acute or chronic sinusitis. 3.  Her recurrent facial pain and headaches are likely a result of non-sinus causes, such as neurogenic headaches/migraine headaches.  Plan: 1.  The physical exam and nasal endoscopy findings are reviewed with the patient. 2.  The patient is reassured and no acute infection is noted today. 3.  Continue with her budesonide nasal spray daily. 4.  The patient will benefit from a neurology evaluation and treatment.  A referral will be arranged soon as possible.  Oriyah Lamphear W Catcher Dehoyos 06/07/2023, 11:43 AM

## 2023-06-21 ENCOUNTER — Other Ambulatory Visit: Payer: Self-pay | Admitting: Family Medicine

## 2023-06-21 DIAGNOSIS — Z0000 Encounter for general adult medical examination without abnormal findings: Secondary | ICD-10-CM

## 2023-06-22 ENCOUNTER — Telehealth (INDEPENDENT_AMBULATORY_CARE_PROVIDER_SITE_OTHER): Payer: Self-pay | Admitting: Otolaryngology

## 2023-06-22 NOTE — Telephone Encounter (Signed)
Per Dr. Suszanne Conners , I left a message on patient 's voice mail that her sinus scan was normal.

## 2023-06-28 ENCOUNTER — Ambulatory Visit: Payer: BC Managed Care – PPO | Admitting: Cardiovascular Disease

## 2023-07-02 ENCOUNTER — Encounter: Payer: Self-pay | Admitting: Cardiovascular Disease

## 2023-07-02 ENCOUNTER — Ambulatory Visit: Payer: 59 | Attending: Cardiovascular Disease | Admitting: Cardiovascular Disease

## 2023-07-02 VITALS — BP 124/71 | HR 80 | Ht 60.0 in | Wt 139.8 lb

## 2023-07-02 DIAGNOSIS — I10 Essential (primary) hypertension: Secondary | ICD-10-CM | POA: Diagnosis not present

## 2023-07-02 DIAGNOSIS — J301 Allergic rhinitis due to pollen: Secondary | ICD-10-CM | POA: Diagnosis not present

## 2023-07-02 DIAGNOSIS — R0683 Snoring: Secondary | ICD-10-CM | POA: Diagnosis not present

## 2023-07-02 DIAGNOSIS — E785 Hyperlipidemia, unspecified: Secondary | ICD-10-CM | POA: Diagnosis not present

## 2023-07-02 NOTE — Patient Instructions (Signed)
 Medication Instructions:  Increase the Losartan 50mg  daily.  Monitor your blood pressure daily. You may continue on the hydrochlorothiazide. If you find that your blood pressure is dropping too low, cut back on that medication to every other day.   *If you need a refill on your cardiac medications before your next appointment, please call your pharmacy*   Lab Work: No labs were ordered during today's visit.  If you have labs (blood work) drawn today and your tests are completely normal, you will receive your results only by: MyChart Message (if you have MyChart) OR A paper copy in the mail If you have any lab test that is abnormal or we need to change your treatment, we will call you to review the results.   Testing/Procedures: No procedures ordered today.    Follow-Up: At [facility], you and your health needs are our priority.  As part of our continuing mission to provide you with exceptional heart care, we have created designated Provider Care Teams.  These Care Teams include your primary Cardiologist (physician) and Advanced Practice Providers (APPs -  Physician Assistants and Nurse Practitioners) who all work together to provide you with the care you need, when you need it.  We recommend signing up for the patient portal called "MyChart".  Sign up information is provided on this After Visit Summary.  MyChart is used to connect with patients for Virtual Visits (Telemedicine).  Patients are able to view lab/test results, encounter notes, upcoming appointments, etc.  Non-urgent messages can be sent to your provider as well.   To learn more about what you can do with MyChart, go to [email or web address].    Your next appointment:   6 month(s)  Provider:   Chilton Si, MD    Other Instructions Thank you for choosing [facility]!

## 2023-07-02 NOTE — Progress Notes (Unsigned)
 Cardiology Office Note    Date:  07/04/2023   ID:  Desiree Yu, DOB 01/30/1961, MRN [id number]  PCP:  Desiree Nian, MD  Cardiologist:  Desiree Guadalajara, MD   3 month F/U cardiology evaluation initially referred by Desiree Yu and sees Desiree Yu for primary care.   History of Present Illness:  Desiree Yu is a 63 y.o. female who is followed by Desiree Yu and has issues with chronic rhinitis, hypertension, hyperlipidemia, and recent blood pressure lability.  She has been on hydrochlorothiazide 12.5 mg for hypertension and has been taking atorvastatin 20 mg with omega-3 fatty acid for approximately 10 years for hyperlipidemia.  Recently, she has had issues with increasing sinus congestion which has affected her sleep.  She typically goes to bed at 10 PM and wakes up at 7.  She is aware of mild snoring.  She states her blood pressure at home typically runs in the 1 30-1 40 range with diastolics in the 80s.  However, recently her blood pressure has become elevated  with systolic in the 150-160 range and diastolics in the 80-90 range.  She has recently been seen by Desiree Yu of allergy and asthma with blood pressure elevation.  I saw her on March 23, 2023 for my initial cardiology evaluation.  During her initial evaluation, her blood pressure was elevated.  I discussed most recent hypertensive guidelines with ideal blood pressure less than 120/80.  I discussed stage I hypertension commencing at 130/80 and stage II at 140/90.  We also discussed that for every 20/10 increase in blood pressure there is a potential doubling of cardiovascular risk..  With her recent sinus congestion she had noticed reduced sleep efficiency and development of some mild snoring.  I recommended she undergo a 2D echo Doppler study.  I initiated low-dose losartan 25 mg and recommended if BP dropped below 110 the discontinuance of hydrochlorothiazide..  I also discussed the possibility of future sleep study if  sleep issues persist.  She underwent an echo Doppler study on April 30, 2023.  This revealed normal LV function with an EF 60 to 65% with mild grade 1 diastolic relaxation impairment.  There was minimal dilatation of the ascending aorta 40 mm.  Valves were essentially normal.  Presently she feels well.  Her blood pressure has been stable in the mid to upper 120s with diastolics in the 80s.  She denies chest pain or shortness of breath.  She will be seeing Desiree Yu for primary care in the spring.  She presents for reevaluation.   Past Medical History:  Diagnosis Date   Allergy    RHINITIS   Anxiety    Hemorrhoids    Hx of colonic polyps    IBS (irritable bowel syndrome)    Right ear pain     Past Surgical History:  Procedure Laterality Date   COLONOSCOPY  2011   Dr. Ewing Schlein   SKIN BIOPSY Left 02/04/2020   skin shave biopsy, left anterior leg Verruca vulgaris   SKIN BIOPSY Left 10/11/2021   postsurgical scar no neoplasm   skin ex Left 03/05/2020   SKin excision left anterior thorax: post surgical scar, No neoplasm is identiied    Current Medications: Outpatient Medications Prior to Visit  Medication Sig Dispense Refill   ALPRAZolam (XANAX) 0.25 MG tablet Take 1 tablet (0.25 mg total) by mouth as needed for anxiety. 20 tablet 0   Ascorbic Acid (VITAMIN C) 500 MG CAPS as  directed Orally     atorvastatin (LIPITOR) 20 MG tablet TAKE 1 TABLET(20 MG) BY MOUTH DAILY 90 tablet 3   budesonide (PULMICORT) 0.5 MG/2ML nebulizer solution Mix one vial into the [facility] bottle and instill through each nostril once daily. 120 mL 5   Coenzyme Q10 (CO Q 10 PO) Take 1 capsule by mouth.     COLLAGEN PO Take 6 tablets by mouth daily.     doxycycline (VIBRA-TABS) 100 MG tablet Take 100 mg by mouth daily. Patient takes 1/2 Tab in the morning and 1/2 Tab at night     fish oil-omega-3 fatty acids 1000 MG capsule Take 1 g by mouth daily.     hydrochlorothiazide (HYDRODIURIL) 12.5 MG tablet TAKE  1 TABLET(12.5 MG) BY MOUTH DAILY 90 tablet 3   loratadine-pseudoephedrine (CLARITIN-D 24-HOUR) 10-240 MG 24 hr tablet Take 1 tablet by mouth daily.     losartan (COZAAR) 25 MG tablet Take 1 tablet (25 mg total) by mouth daily. 90 tablet 3   Multiple Vitamins-Minerals (MULTIVITAMIN WITH MINERALS) tablet Take 1 tablet by mouth daily.     OVER THE COUNTER MEDICATION Zyflamend     Sodium Chloride-Xylitol (XLEAR SINUS CARE SPRAY NA)      Tobramycin-dexAMETHasone 0.3-0.05 % SUSP Place 5 mLs into both eyes daily. Patient takes Drops 2 times  a day in both eyes     traZODone (DESYREL) 50 MG tablet TAKE 1 TABLET(50 MG) BY MOUTH AT BEDTIME AS NEEDED FOR SLEEP 30 tablet 1   Turmeric 500 MG CAPS 2 capsules Orally once a day     predniSONE (STERAPRED UNI-PAK 48 TAB) 10 MG (48) TBPK tablet Take as per manufacturer's recommendations (Patient not taking: Reported on 06/06/2023) 48 tablet 0   TURMERIC CURCUMIN PO Take by mouth. (Patient not taking: Reported on 07/02/2023)     No facility-administered medications prior to visit.     Allergies:   E-mycin [erythromycin base]   Social History   Socioeconomic History   Marital status: Married    Spouse name: Not on file   Number of children: Not on file   Years of education: Not on file   Highest education level: Not on file  Occupational History   Not on file  Tobacco Use   Smoking status: Never   Smokeless tobacco: Never  Vaping Use   Vaping status: Never Used  Substance and Sexual Activity   Alcohol use: Yes    Alcohol/week: 7.0 standard drinks of alcohol    Types: 7 Glasses of wine per week   Drug use: No   Sexual activity: Yes    Partners: Male    Comment: married  Other Topics Concern   Not on file  Social History Narrative   ** Merged History Encounter **       Social Drivers of Corporate investment banker Strain: Not on file  Food Insecurity: Not on file  Transportation Needs: Not on file  Physical Activity: Not on file  Stress:  Not on file  Social Connections: Not on file    Socially she was born in [place].  She is married for 23 years.  She does not have children.  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  There is no tobacco history.  She does drink wine.  She has taken trazodone for sleep issues.  She walks approximately 1 hour and does yoga 3 to 4 days/week  Family History:  The patient's family history includes Arthritis in  her father; Hypertension in her mother; Stroke in her maternal grandfather and maternal uncle.  Mother is living and 20 years old and has history of hypertension and heart rate irregularity.  Father died age 62 with dementia and had hyperlipidemia.  She has a sister age 29 with hypertension and hyperlipidemia.  ROS General: Negative; No fevers, chills, or night sweats;  HEENT: Negative; No changes in vision or hearing, sinus congestion, difficulty swallowing Pulmonary: Sinus congestion, allergic rhinitis Cardiovascular: Recent blood pressure elevation;  No chest pain, presyncope, syncope, palpitations GI: Negative; No nausea, vomiting, diarrhea, or abdominal pain GU: Negative; No dysuria, hematuria, or difficulty voiding Musculoskeletal: Negative; no myalgias, joint pain, or weakness Hematologic/Oncology: Negative; no easy bruising, bleeding Endocrine: Negative; no heat/cold intolerance; no diabetes Neuro: Negative; no changes in balance, headaches Skin: Negative; No rashes or skin lesions Psychiatric: Negative; No behavioral problems, depression Sleep: Positive for snoring.  She believes this has been exacerbated by her sinus congestion; no daytime sleepiness, hypersomnolence, bruxism, restless legs, hypnogognic hallucinations, no cataplexy Other comprehensive 14 point system review is negative.   PHYSICAL EXAM:   VS:  BP 124/71   Pulse 80   Ht 5' (1.524 m)   Wt 139 lb 12.8 oz (63.4 kg)   SpO2 99%   BMI 27.30 kg/m     Repeat blood pressure by me was  124/85  Wt Readings from Last 3 Encounters:  07/02/23 139 lb 12.8 oz (63.4 kg)  06/06/23 135 lb (61.2 kg)  04/25/23 140 lb (63.5 kg)    General: Alert, oriented, no distress.  Skin: normal turgor, no rashes, warm and dry HEENT: Normocephalic, atraumatic. Pupils equal round and reactive to light; sclera anicteric; extraocular muscles intact; Fundi ** Nose without nasal septal hypertrophy Mouth/Parynx benign; Mallinpatti scale 3 Neck: No JVD, no carotid bruits; normal carotid upstroke Lungs: clear to ausculatation and percussion; no wheezing or rales Chest wall: without tenderness to palpitation Heart: PMI not displaced, RRR, s1 s2 normal, 1/6 systolic murmur, no diastolic murmur, no rubs, gallops, thrills, or heaves Abdomen: soft, nontender; no hepatosplenomehaly, BS+; abdominal aorta nontender and not dilated by palpation. Back: no CVA tenderness Pulses 2+ Musculoskeletal: full range of motion, normal strength, no joint deformities Extremities: no clubbing cyanosis or edema, Homan's sign negative  Neurologic: grossly nonfocal; Cranial nerves grossly wnl Psychologic: Normal mood and affect      Studies/Labs Reviewed:   EKG Interpretation Date/Time:  Monday July 02 2023 10:09:31 EST Ventricular Rate:  80 PR Interval:  182 QRS Duration:  86 QT Interval:  380 QTC Calculation: 438 R Axis:   10  Text Interpretation: Normal sinus rhythm Possible Inferior infarct , age undetermined When compared with ECG of 23-Mar-2023 08:47, No significant change was found Confirmed by Desiree Yu ([id number]) on 07/04/2023 5:13:15 PM    March 23, 2023 ECG (independently read by me): Normal sinus rhythm at 71 bpm, QTc interval 432 ms, PR interval 160 ms.  Nonspecific T wave abnormality.  Recent Labs:    Latest Ref Rng & Units 04/26/2023    9:52 AM 08/30/2022    2:44 PM 08/10/2021   11:56 AM  BMP  Glucose 70 - 99 mg/dL 811  96  95   BUN 8 - 27 mg/dL 17  13  12    Creatinine 0.57 - 1.00 mg/dL  9.14  7.82  9.56   BUN/Creat Ratio 12 - 28 29  23  19    Sodium 134 - 144 mmol/L 136  136  142  Potassium 3.5 - 5.2 mmol/L 4.2  4.5  4.5   Chloride 96 - 106 mmol/L 99  98  104   CO2 20 - 29 mmol/L 19  22  22    Calcium 8.7 - 10.3 mg/dL 9.2  9.9  9.7         Latest Ref Rng & Units 08/30/2022    2:44 PM 08/10/2021   11:56 AM 08/11/2020   10:12 AM  Hepatic Function  Total Protein 6.0 - 8.5 g/dL 7.5  6.9  6.9   Albumin 3.9 - 4.9 g/dL 4.8  5.0  4.7   AST 0 - 40 IU/L 26  23  19    ALT 0 - 32 IU/L 20  20  16    Alk Phosphatase 44 - 121 IU/L 78  61  70   Total Bilirubin 0.0 - 1.2 mg/dL 0.8  0.8  0.6        Latest Ref Rng & Units 03/22/2023   10:17 AM 08/30/2022    2:44 PM 08/10/2021   11:56 AM  CBC  WBC 3.4 - 10.8 x10E3/uL 6.8  7.0  6.1   Hemoglobin 11.1 - 15.9 g/dL 09.8  11.9  14.7   Hematocrit 34.0 - 46.6 % 43.6  40.4  41.3   Platelets 150 - 450 x10E3/uL 429  406  347    Lab Results  Component Value Date   MCV 96 03/22/2023   MCV 90 08/30/2022   MCV 95 08/10/2021   No results found for: "TSH" No results found for: "HGBA1C"   BNP No results found for: "BNP"  ProBNP No results found for: "PROBNP"   Lipid Panel     Component Value Date/Time   CHOL 183 08/30/2022 1444   TRIG 85 08/30/2022 1444   HDL 84 08/30/2022 1444   CHOLHDL 2.2 08/30/2022 1444   CHOLHDL 1.9 04/05/2016 1330   VLDL 12 04/05/2016 1330   LDLCALC 84 08/30/2022 1444   LABVLDL 15 08/30/2022 1444     RADIOLOGY: No results found.   Additional studies/ records that were reviewed today include:  Records of Desiree Yu and Desiree Yu were reviewed.   ASSESSMENT:    1. Essential hypertension   2. Hyperlipidemia with target LDL less than 70   3. Snoring   4. Non-seasonal allergic rhinitis due to pollen    PLAN:  Desiree Yu is a very pleasant 63 year old female who has a history of hypertension as well as hyperlipidemia.  Recently she has had issues with sinus congestion and allergic  rhinitis and has undergone allergy testing with Dr. Lynelle Smoke.  With her increased sinus congestion she has had reduced sleep efficacy and admits to recent snoring.  Previously she has been on hydrochlorothiazide 12.5 mg for hypertension.  At her initial evaluation with me, blood pressure was elevated at 150/90.  I recommended the addition of losartan initially at 25 mg to her current regimen.  She underwent a 2D echo Doppler study on April 30, 2023 which showed normal LV function with EF at 60 to 65% and mild grade 1 diastolic relaxation impairment.  There was minimal dilatation of the ascending aorta at 40 mm.  Valves were essentially normal.  Her blood pressure has improved since initiating losartan.  Presently there is no edema.  I have recommended titration of losartan to 50 mg with possible discontinuance of HCTZ.  Upon response.  Laboratory from December 19 showed BUN 17 creatinine 0.59.  She sees Desiree Yu for primary  care who checks laboratory.  Lipid studies in April 2024 showed total cholesterol 183 HDL 84 LDL 84 and triglycerides 85.  She will be following up with Desiree Yu in several months with repeat laboratory.  I discussed with her my plans for upcoming retirement later this year.  Following my retirement, I will transition her to the care of Dr. Chilton Si in our [place] office with initial evaluation in approximately 6 to 7 months.  Her sinus congestion has improved and her sleep is better.  At her initial evaluation I did discuss potential future sleep evaluation if issues persisted and reviewed potential adverse cardiovascular consequences of untreated sleep apnea if present.    Medication Adjustments/Labs and Tests Ordered: Current medicines are reviewed at length with the patient today.  Concerns regarding medicines are outlined above.  Medication changes, Labs and Tests ordered today are listed in the Patient Instructions below. Patient Instructions   Medication Instructions:  Increase the Losartan 50mg  daily.  Monitor your blood pressure daily. You may continue on the hydrochlorothiazide. If you find that your blood pressure is dropping too low, cut back on that medication to every other day.   *If you need a refill on your cardiac medications before your next appointment, please call your pharmacy*   Lab Work: No labs were ordered during today's visit.  If you have labs (blood work) drawn today and your tests are completely normal, you will receive your results only by: MyChart Message (if you have MyChart) OR A paper copy in the mail If you have any lab test that is abnormal or we need to change your treatment, we will call you to review the results.   Testing/Procedures: No procedures ordered today.    Follow-Up: At [facility], you and your health needs are our priority.  As part of our continuing mission to provide you with exceptional heart care, we have created designated Provider Care Teams.  These Care Teams include your primary Cardiologist (physician) and Advanced Practice Providers (APPs -  Physician Assistants and Nurse Practitioners) who all work together to provide you with the care you need, when you need it.  We recommend signing up for the patient portal called "MyChart".  Sign up information is provided on this After Visit Summary.  MyChart is used to connect with patients for Virtual Visits (Telemedicine).  Patients are able to view lab/test results, encounter notes, upcoming appointments, etc.  Non-urgent messages can be sent to your provider as well.   To learn more about what you can do with MyChart, go to [email or web address].    Your next appointment:   6 month(s)  Provider:   Chilton Si, MD    Other Instructions Thank you for choosing [facility]!       Signed, Desiree Guadalajara, MD  07/04/2023 5:18 PM    [facility] [address], [address],  [place], [place]  [address] Phone: [phone]

## 2023-07-03 ENCOUNTER — Encounter: Payer: Self-pay | Admitting: Internal Medicine

## 2023-07-04 ENCOUNTER — Encounter: Payer: Self-pay | Admitting: Cardiovascular Disease

## 2023-07-04 ENCOUNTER — Ambulatory Visit
Admission: RE | Admit: 2023-07-04 | Discharge: 2023-07-04 | Disposition: A | Discharge status: Home / Self Care | Payer: 59 | Source: Ambulatory Visit | Attending: Family Medicine | Admitting: Family Medicine

## 2023-07-04 DIAGNOSIS — Z0000 Encounter for general adult medical examination without abnormal findings: Secondary | ICD-10-CM

## 2023-07-06 ENCOUNTER — Ambulatory Visit: Payer: Self-pay | Admitting: Physician Assistant

## 2023-07-12 NOTE — Progress Notes (Signed)
 Initial neurology clinic note  Desiree Yu MRN: [id number] DOB: 02/25/1961  Referring provider: Newman Pies, MD  Primary care provider: Ronnald Nian, MD  Reason for consult:  headache and face pain  Subjective:  This is Ms. Desiree Yu, a 63 y.o. right-handed female with a medical history of HLD, HTN, IBS, anxiety who presents to neurology clinic with headache and face pain. The patient is alone today.  For about 10 years, patient has felt like she had sinus problems (drainage, pressure, and headaches more at night). Over the past 3 years, she has had an increase in mucus. It is very thick, like jellyfish. She has worse symptoms at night when she is laying for longer periods of time. She has headaches that wake her up as well. She describes it as pressure in forehead and around nose. She will wake up and do a saline rinse, steam and this will help. She has taken Claritin that seems to help her. She does not have headaches unless since has the sinus issues and mucus.  In the past month, her eyes have been very dry. Her eye doctor has diagnosed her with blepharitis and saw a retina specialist. The retina specialist asked about sinusitis and is wondering if she has a blocked duct.   She has been seen by ENT x 3. She has also seen allergy. She was told she had some grass allergies, but nothing too significant. She has been treated with antibiotics and steroids multiple times (last 04/2023). This does help. Previous CT scans in 2022 and 2023 were negative for significant sinusitis. She has also had cryoablation therapy at [facility] (2023) for chronic postnasal drainage (right side was completed but left was not able to be completed). Her symptoms were more on the right previously (but on both), but since the ablation has been more on the left.  She denies photophobia, phonophobia, nausea, vomiting, or vision changes/loss.  She denies jaw claudication, temporal pain, fevers, night sweats, or  unintended weight loss.  EtOH use: drinks 1 glass of wine per day Caffeine use: 2 cups of 1/2 caf coffee per morning Smoker: Never Restrictive diet: No Family history of neurologic disease, including headaches: mother with vascular dementia  MEDICATIONS:  Outpatient Encounter Medications as of 07/20/2023  Medication Sig Note   ALPRAZolam (XANAX) 0.25 MG tablet Take 1 tablet (0.25 mg total) by mouth as needed for anxiety. 06/06/2023: Taking PRN   Ascorbic Acid (VITAMIN C) 500 MG CAPS as directed Orally    atorvastatin (LIPITOR) 20 MG tablet TAKE 1 TABLET(20 MG) BY MOUTH DAILY    budesonide (PULMICORT) 0.5 MG/2ML nebulizer solution Mix one vial into the [employer] bottle and instill through each nostril once daily.    Coenzyme Q10 (CO Q 10 PO) Take 1 capsule by mouth.    COLLAGEN PO Take 6 tablets by mouth daily.    doxycycline (VIBRA-TABS) 100 MG tablet Take 100 mg by mouth daily. Patient takes 1/2 Tab in the morning and 1/2 Tab at night    fish oil-omega-3 fatty acids 1000 MG capsule Take 1,400 mg by mouth daily.    hydrochlorothiazide (HYDRODIURIL) 12.5 MG tablet TAKE 1 TABLET(12.5 MG) BY MOUTH DAILY    loratadine-pseudoephedrine (CLARITIN-D 24-HOUR) 10-240 MG 24 hr tablet Take 1 tablet by mouth daily. 03/23/2023: As needed.   losartan (COZAAR) 25 MG tablet Take 1 tablet (25 mg total) by mouth daily. (Patient taking differently: Take 50 mg by mouth daily.)    Multiple Vitamins-Minerals (MULTIVITAMIN  WITH MINERALS) tablet Take 1 tablet by mouth daily.    OVER THE COUNTER MEDICATION Zyflamend 02/28/2022: prn   Sodium Chloride-Xylitol (XLEAR SINUS CARE SPRAY NA)  02/28/2022: prn   traZODone (DESYREL) 50 MG tablet TAKE 1 TABLET(50 MG) BY MOUTH AT BEDTIME AS NEEDED FOR SLEEP    Turmeric 500 MG CAPS 2 capsules Orally once a day    Tobramycin-dexAMETHasone 0.3-0.05 % SUSP Place 5 mLs into both eyes daily. Patient takes Drops 2 times  a day in both eyes (Patient not taking: Reported on 07/20/2023)     No facility-administered encounter medications on file as of 07/20/2023.    PAST MEDICAL HISTORY: Past Medical History:  Diagnosis Date   Allergy    RHINITIS   Anxiety    Hemorrhoids    Hx of colonic polyps    IBS (irritable bowel syndrome)    Right ear pain     PAST SURGICAL HISTORY: Past Surgical History:  Procedure Laterality Date   COLONOSCOPY  2011   Dr. Ewing Schlein   SKIN BIOPSY Left 02/04/2020   skin shave biopsy, left anterior leg Verruca vulgaris   SKIN BIOPSY Left 10/11/2021   postsurgical scar no neoplasm   skin ex Left 03/05/2020   SKin excision left anterior thorax: post surgical scar, No neoplasm is identiied    ALLERGIES: Allergies  Allergen Reactions   E-Mycin [Erythromycin Base] Rash    FAMILY HISTORY: Family History  Problem Relation Age of Onset   Hypertension Mother    Arthritis Father    Stroke Maternal Uncle    Stroke Maternal Grandfather     SOCIAL HISTORY: Social History   Tobacco Use   Smoking status: Never   Smokeless tobacco: Never  Vaping Use   Vaping status: Never Used  Substance Use Topics   Alcohol use: Yes    Alcohol/week: 7.0 standard drinks of alcohol    Types: 7 Glasses of wine per week    Comment: 1 glass of wine a day   Drug use: No   Social History   Social History Narrative   ** Merged History Encounter **   Are you right handed or left handed? Right   Are you currently employed ?    What is your current occupation? retired   Do you live at home alone?   Who lives with you? married   What type of home do you live in: 1 story or 2 story? Two    Caffiene 2 cups of 1/2 and 1/2 daily     Objective:  Vital Signs:  BP 110/64   Pulse 80   Ht 5' (1.524 m)   Wt 139 lb (63 kg)   SpO2 97%   BMI 27.15 kg/m   General: No acute distress.  Patient appears well-groomed.   Head:  Normocephalic/atraumatic Eyes:  fundi examined, disc margins clear, no obvious papilledema Neck: supple, no paraspinal tenderness, full  range of motion Heart: regular rate and rhythm Lungs: Clear to auscultation bilaterally. Vascular: No carotid bruits.  Neurological Exam: Mental status: alert and oriented, speech fluent and not dysarthric, language intact.  Cranial nerves: CN I: not tested CN II: pupils equal, round and reactive to light, visual fields intact CN III, IV, VI:  full range of motion, no nystagmus, no ptosis CN V: facial sensation intact. CN VII: upper and lower face symmetric CN VIII: hearing intact CN IX, X: uvula midline CN XI: sternocleidomastoid and trapezius muscles intact CN XII: tongue midline  Bulk & Tone: normal.  Motor:  muscle strength 5/5 throughout Deep Tendon Reflexes:  2+ throughout.   Sensation:  Light touch sensation intact. Finger to nose testing:  Without dysmetria.   Gait:  Normal station and stride.  Romberg negative.   Labs and Imaging review: Internal labs: BMP (04/26/23) significant for glucose 109 CBC w/ diff (03/22/23) unremarkable (MCV 96) Lipid panel (08/30/22): tChol 183, LDL 84, TG 85  Imaging/Procedures: CT soft tissue neck (12/22/20): IMPRESSION: Mild enlargement of left submandibular lymph nodes. No visible underlying inflammation or mass throughout the neck.  CT maxillofacial wo contrast (05/28/20): IMPRESSION: Mild mucosal edema sphenoid sinus. Remaining sinuses clear. Sinus drainage pathways clear.  CT maxillofacial wo contrast (12/27/21): FINDINGS:  Lymph Nodes: No enlarged or abnormal appearing lymph nodes.   Suprahyoid Neck: Portions of the oral cavity are obscured by streak  artifact from dental amalgam. The nasopharynx, oropharynx, oral cavity,  parapharyngeal space, and retropharyngeal space are otherwise normal.  Infrahyoid Neck: The larynx and hypopharynx are normal.  Salivary Glands: The parotid and submandibular glands are normal.  Thyroid: Normal.   Brain and Skull Base: The visualized portions of the brain and skull base  are normal.   Orbits: Normal.  Paranasal Sinuses: Minimal mucosal thickening of the right maxillary sinus   Partially Visualized Thorax: The visualized lung apices and upper thorax  are unremarkable.  Vasculature: The visualized major vessels of the neck are normal.  Bones: No lytic or blastic osseous lesions.  Additional Findings: None.    IMPRESSION:  1. Normal appearance of the palatine and lingual tonsils.  2.  No cervical lymphadenopathy.   Assessment/Plan:  AMAIYA SCRUTON is a 63 y.o. female who presents for evaluation of headaches and facial pressure in the setting of sinus/mucus drainage. She has a relevant medical history of HLD, HTN, IBS, anxiety. Her neurological examination is normal today. Patient's symptoms do sound sinus related. Her headaches only occur when she has mucus drainage and more when she sleeps (dependent position). Her symptoms improve with clearing her drainage. Her headaches do not sound consistent with a primary headache syndrome such as migraine, TN, cluster, etc. There are no red flag symptoms.  PLAN: -Blood work: B1, B12, TSH -Discussed MRI brain, but given normal exam, patient agreed to deferred -Discussed headache medications, but patient prefers to hold as she has not had recent headaches  -Return to clinic as needed  The impression above as well as the plan as outlined below were extensively discussed with the patient who voiced understanding. All questions were answered to their satisfaction.  When available, results of the above investigations and possible further recommendations will be communicated to the patient via telephone/MyChart. Patient to call office if not contacted after expected testing turnaround time.   Total time spent reviewing records, interview, history/exam, documentation, and coordination of care on day of encounter:  55 min   Thank you for allowing me to participate in patient's care.  If I can answer any additional questions, I would be  pleased to do so.  Jacquelyne Balint, MD   CC: Ronnald Nian, MD [address] [place] [address]  CC: Referring provider: Newman Pies, MD [address] [place],  [place] [address]

## 2023-07-20 ENCOUNTER — Encounter: Payer: Self-pay | Admitting: Neurology

## 2023-07-20 ENCOUNTER — Other Ambulatory Visit

## 2023-07-20 ENCOUNTER — Ambulatory Visit: Payer: 59 | Admitting: Neurology

## 2023-07-20 VITALS — BP 110/64 | HR 80 | Ht 60.0 in | Wt 139.0 lb

## 2023-07-20 DIAGNOSIS — R519 Headache, unspecified: Secondary | ICD-10-CM | POA: Diagnosis not present

## 2023-07-20 DIAGNOSIS — J3489 Other specified disorders of nose and nasal sinuses: Secondary | ICD-10-CM | POA: Diagnosis not present

## 2023-07-20 NOTE — Patient Instructions (Signed)
 I saw you today for headaches and facial pressure. Your symptoms do sound sinus related and not like migraines or other primary headache. We did discuss a trial of headache medication, but given that this may not be helpful, you would rather hold off for now. Please reach out if you change your mind about this.  I will get some labs today and be in touch when I have the results.  Please let me know if you have any questions or concerns in the meantime.  The physicians and staff at [facility] are committed to providing excellent care. You may receive a survey requesting feedback about your experience at our office. We strive to receive "very good" responses to the survey questions. If you feel that your experience would prevent you from giving the office a "very good " response, please contact our office to try to remedy the situation. We may be reached at [phone]. Thank you for taking the time out of your busy day to complete the survey.  Jacquelyne Balint, MD [facility]

## 2023-07-25 ENCOUNTER — Encounter: Payer: Self-pay | Admitting: Neurology

## 2023-07-25 LAB — VITAMIN B12: Vitamin B-12: 571 pg/mL (ref 200–1100)

## 2023-07-25 LAB — TSH: TSH: 2.6 m[IU]/L (ref 0.40–4.50)

## 2023-07-25 LAB — VITAMIN B1: Vitamin B1 (Thiamine): 15 nmol/L (ref 8–30)

## 2023-08-07 ENCOUNTER — Encounter: Payer: Self-pay | Admitting: Family Medicine

## 2023-08-07 DIAGNOSIS — F418 Other specified anxiety disorders: Secondary | ICD-10-CM

## 2023-08-07 MED ORDER — ALPRAZOLAM 0.25 MG PO TABS: 0.2500 mg | ORAL_TABLET | ORAL | 0 refills | Status: AC | PRN

## 2023-08-09 ENCOUNTER — Encounter: Payer: Self-pay | Admitting: Cardiovascular Disease

## 2023-08-09 MED ORDER — LOSARTAN POTASSIUM 50 MG PO TABS: 50.0000 mg | ORAL_TABLET | Freq: Every day | ORAL | 3 refills | Status: DC

## 2023-09-13 ENCOUNTER — Ambulatory Visit (INDEPENDENT_AMBULATORY_CARE_PROVIDER_SITE_OTHER): Payer: BC Managed Care – PPO | Admitting: Family Medicine

## 2023-09-13 VITALS — BP 112/82 | HR 76 | Ht 60.0 in | Wt 138.2 lb

## 2023-09-13 DIAGNOSIS — I10 Essential (primary) hypertension: Secondary | ICD-10-CM | POA: Diagnosis not present

## 2023-09-13 DIAGNOSIS — J310 Chronic rhinitis: Secondary | ICD-10-CM | POA: Diagnosis not present

## 2023-09-13 DIAGNOSIS — E785 Hyperlipidemia, unspecified: Secondary | ICD-10-CM | POA: Diagnosis not present

## 2023-09-13 DIAGNOSIS — Z8719 Personal history of other diseases of the digestive system: Secondary | ICD-10-CM

## 2023-09-13 DIAGNOSIS — G479 Sleep disorder, unspecified: Secondary | ICD-10-CM | POA: Diagnosis not present

## 2023-09-13 DIAGNOSIS — Z83719 Family history of colon polyps, unspecified: Secondary | ICD-10-CM

## 2023-09-13 MED ORDER — HYDROCHLOROTHIAZIDE 12.5 MG PO TABS
ORAL_TABLET | ORAL | 3 refills | Status: AC
Start: 2023-09-13 — End: ?

## 2023-09-13 MED ORDER — LOSARTAN POTASSIUM 50 MG PO TABS: 50.0000 mg | ORAL_TABLET | Freq: Every day | ORAL | 3 refills | Status: AC

## 2023-09-13 MED ORDER — ATORVASTATIN CALCIUM 20 MG PO TABS: ORAL_TABLET | ORAL | 3 refills | Status: AC

## 2023-09-13 NOTE — Progress Notes (Signed)
 Desiree Yu is a 63 y.o. female who presents for welcome to Medicare visit and follow-up on chronic medical conditions.  She has no particular concerns or complaints.  She does get colonoscopies every 5 years.  Her allergies are under good control.  She is now also using a Pulmicort  product but using it more nasally.  Her last allergist gave her this to try.  This seems to be holding her own.  She continues on losartan  and does also take HCTZ.  Continues on Lipitor and having no difficulty with that does occasionally use trazodone  to help with sleep.  Xanax  is mainly for travel.  She does have a hiatus hernia but is not having any difficulty with that.  Immunizations and Health Maintenance Immunization History  Administered Date(s) Administered   Influenza Inj Mdck Quad Pf 01/03/2019   Influenza, Seasonal, Injecte, Preservative Fre 02/16/2023   Influenza,inj,Quad PF,6+ Mos 01/24/2013, 03/17/2014, 05/20/2015, 04/05/2016, 03/06/2017, 01/05/2021   Influenza-Unspecified 03/06/2017, 12/24/2019, 02/05/2022   Moderna Covid-19 Fall Seasonal Vaccine 81yrs & older 02/16/2023   Moderna Covid-19 Vaccine Bivalent Booster 86yrs & up 01/31/2021   Moderna Sars-Covid-2 Vaccination 07/21/2019, 08/19/2019, 03/29/2020   PNEUMOCOCCAL CONJUGATE-20 04/25/2023   Respiratory Syncytial Virus Vaccine,Recomb Aduvanted(Arexvy) 02/05/2022   Tdap 09/17/2014   Unspecified SARS-COV-2 Vaccination 02/05/2022   Zoster Recombinant(Shingrix) 09/08/2016, 11/16/2016   Zoster, Live 05/20/2015   Health Maintenance Due  Topic Date Due   Medicare Annual Wellness (AWV)  Never done   HIV Screening  Never done   Cervical Cancer Screening (HPV/Pap Cotest)  05/09/2021    Last Pap smear: September 2024. Last mammogram: July 06, 2023 Last colonoscopy: On a 5-year schedule Last DEXA: February 05, 2023.  Dentist: January 2025.  Ophtho: April 2025.  Exercise: walking 4 or 5 times a week, weights, and yoga.   Other doctors caring  for patient include:  Advanced directives: Yes.  Copy ask for.    Depression screen:  See questionnaire below.     08/30/2022    1:43 PM 08/10/2021   11:23 AM 06/03/2021   11:50 AM 01/05/2021    2:32 PM 10/07/2020    8:19 AM  Depression screen PHQ 2/9  Decreased Interest 0 0 0 0 0  Down, Depressed, Hopeless 0 0 0 0 0  PHQ - 2 Score 0 0 0 0 0  Altered sleeping 0  0 1 0  Tired, decreased energy 0  0 0 0  Change in appetite 0  0 0 0  Feeling bad or failure about yourself  0  0 0 0  Trouble concentrating 0  0 0 0  Moving slowly or fidgety/restless 0  0 0 0  Suicidal thoughts 0  0 0 0  PHQ-9 Score 0  0 1 0  Difficult doing work/chores Not difficult at all  Not difficult at all Not difficult at all Not difficult at all    Fall Risk Screen: see questionnaire below.    07/20/2023    7:56 AM 08/30/2022    1:44 PM 08/10/2021   11:23 AM 01/05/2021    2:32 PM 08/11/2020    9:21 AM  Fall Risk   Falls in the past year? 0 0 0 0 0  Number falls in past yr: 0 1 0 0 0  Injury with Fall? 0 0 0 0 0  Risk for fall due to :  No Fall Risks No Fall Risks No Fall Risks No Fall Risks  Follow up Falls evaluation completed Falls evaluation completed Falls  evaluation completed Falls evaluation completed Falls evaluation completed    ADL screen:  See questionnaire below Functional Status Survey:     Review of Systems Constitutional: -, -unexpected weight change, -anorexia, -fatigue Allergy : -sneezing, -itching, -congestion Dermatology: denies changing moles, rash, lumps ENT: -runny nose, -ear pain, -sore throat,  Cardiology:  -chest pain, -palpitations, -orthopnea, Respiratory: -cough, -shortness of breath, -dyspnea on exertion, -wheezing,  Gastroenterology: -abdominal pain, -nausea, -vomiting, -diarrhea, -constipation, -dysphagia Hematology: -bleeding or bruising problems Musculoskeletal: -arthralgias, -myalgias, -joint swelling, -back pain, - Ophthalmology: -vision changes,  Urology: -dysuria,  -difficulty urinating,  -urinary frequency, -urgency, incontinence Neurology: -, -numbness, , -memory loss, -falls, -dizziness    PHYSICAL EXAM:   General Appearance: Alert, cooperative, no distress, appears stated age Head: Normocephalic, without obvious abnormality, atraumatic Eyes: PERRL, conjunctiva/corneas clear, EOM's intact,  Ears: Normal TM's and external ear canals Nose: Nares normal, mucosa normal, no drainage or sinus tenderness Throat: Lips, mucosa, and tongue normal; teeth and gums normal Neck: Supple, no lymphadenopathy;  thyroid:  no enlargement/tenderness/nodules; no carotid bruit or JVD Lungs: Clear to auscultation bilaterally without wheezes, rales or ronchi; respirations unlabored Heart: Regular rate and rhythm, S1 and S2 normal, no murmur, rubor gallop  Skin:  Skin color, texture, turgor normal, no rashes or lesions Lymph nodes: Cervical, supraclavicular, and axillary nodes normal Neurologic:  CNII-XII intact, normal strength, sensation and gait; reflexes 2+ and symmetric throughout Psych: Normal mood, affect, hygiene and grooming.  ASSESSMENT/PLAN: Hyperlipidemia with target LDL less than 100 - Plan: atorvastatin  (LIPITOR) 20 MG tablet  H/O hiatal hernia  Family history of colonic polyps  Essential hypertension - Plan: losartan  (COZAAR ) 50 MG tablet, hydrochlorothiazide  (HYDRODIURIL ) 12.5 MG tablet  Chronic rhinitis  Sleep disturbance    Discussed monthly self breast exams and yearly mammograms;  Immunization recommendations discussed.  Colonoscopy recommendations reviewed   Medicare Attestation I have personally reviewed: The patient's medical and social history Their use of alcohol, tobacco or illicit drugs Their current medications and supplements The patient's functional ability including ADLs,fall risks, home safety risks, cognitive, and hearing and visual impairment Diet and physical activities Evidence for depression or mood disorders  The  patient's weight, height, and BMI have been recorded in the chart.  I have made referrals, counseling, and provided education to the patient based on review of the above and I have provided the patient with a written personalized care plan for preventive services.     Ron Cobbs, MD   09/13/2023

## 2023-09-20 ENCOUNTER — Encounter: Payer: Self-pay | Admitting: Family Medicine

## 2023-09-20 ENCOUNTER — Ambulatory Visit: Admitting: Family Medicine

## 2023-09-20 VITALS — BP 132/80 | HR 103 | Temp 97.9°F | Wt 137.4 lb

## 2023-09-20 DIAGNOSIS — H02834 Dermatochalasis of left upper eyelid: Secondary | ICD-10-CM | POA: Diagnosis not present

## 2023-09-20 DIAGNOSIS — H0279 Other degenerative disorders of eyelid and periocular area: Secondary | ICD-10-CM | POA: Diagnosis not present

## 2023-09-20 DIAGNOSIS — J039 Acute tonsillitis, unspecified: Secondary | ICD-10-CM

## 2023-09-20 DIAGNOSIS — H02831 Dermatochalasis of right upper eyelid: Secondary | ICD-10-CM | POA: Diagnosis not present

## 2023-09-20 DIAGNOSIS — H0100A Unspecified blepharitis right eye, upper and lower eyelids: Secondary | ICD-10-CM | POA: Diagnosis not present

## 2023-09-20 DIAGNOSIS — H6502 Acute serous otitis media, left ear: Secondary | ICD-10-CM | POA: Diagnosis not present

## 2023-09-20 DIAGNOSIS — H0100B Unspecified blepharitis left eye, upper and lower eyelids: Secondary | ICD-10-CM | POA: Diagnosis not present

## 2023-09-20 MED ORDER — AMOXICILLIN-POT CLAVULANATE 875-125 MG PO TABS: 1.0000 | ORAL_TABLET | Freq: Two times a day (BID) | ORAL | 0 refills | Status: DC

## 2023-09-20 NOTE — Progress Notes (Signed)
   Subjective:    Patient ID: Desiree Yu, female    DOB: 07-06-1960, 63 y.o.   MRN: [id number]  HPI She complains of a 2-day history of purulent postnasal drainage, fatigue, left ear ache and right tonsillar swelling with exudates.   Review of Systems     Objective:    Physical Exam Left TM shows poor landmarks and is erythematous and dull.  Right TM and canal normal.  Throat shows a slightly swollen right tonsil with exudates.  Neck is supple without adenopathy.       Assessment & Plan:  Non-recurrent acute serous otitis media of left ear - Plan: amoxicillin -clavulanate (AUGMENTIN ) 875-125 MG tablet  Tonsillitis She will call me if she continues to have difficulty.

## 2023-09-24 ENCOUNTER — Ambulatory Visit: Payer: Self-pay

## 2023-09-24 NOTE — Telephone Encounter (Signed)
 Copied from CRM [id number]. Topic: Clinical - Medical Advice >> Sep 24, 2023  3:01 PM Yolanda T wrote: Reason for CRM: patient called stated she has been treated for ear pain and tonsils on last Thursday and her tonsils are better but now she is having pressure/pain in her left ear. No appts before Thurs and she wants to be seen tomorrow. Please f/u with patient for medication or appt   Chief Complaint: Left ear pain Symptoms: pain and pressure in left ear Frequency: constant Pertinent Negatives: Patient denies fever Disposition: [] ED /[] Urgent Care (no appt availability in office) / [] Appointment(In office/virtual)/ []  [facility]/ [] Home Care/ [] Refused Recommended Disposition /[] [facility]/ []  Follow-up with PCP Additional Notes: seen on 5/15, started on Augmentin  but reports ear pain is getting worse. Pt now endorsing pain and pressure, feels like somethng is dripping inside ear. Also having pain on the left side of her teeth. NO appts avail within next 24 hours. Will send appt request to pcp office otherwise patient is agreeable to go to UC if no contact today  Reason for Disposition  [1] Taking antibiotic > 72 hours (3 days) and [2] pain persists or recurs  Answer Assessment - Initial Assessment Questions 1. ANTIBIOTIC: "What antibiotic are you taking?" "How many times per day?"     Augmentin  two times a day  2. ONSET: "When was the antibiotic started?"     5/15   3. LOCATION: "Which ear is involved?"     Left ear  4. PAIN: "How bad is the pain?"   (Scale 1-10; mild, moderate or severe)   - MILD (1-3): doesn't interfere with normal activities    - MODERATE (4-7): interferes with normal activities or awakens from sleep    - SEVERE (8-10): excruciating pain, unable to do any normal activities      4/10, but worse at night.  5. FEVER: "Do you have a fever?" If Yes, ask: "What is your temperature, how was it measured, and when did it start?"     No  6.  DISCHARGE: "Is there any discharge from the ear?"     Feels like something is dripping inside of ear  7. OTHER SYMPTOMS: "Do you have any other symptoms?" (e.g., headache, stiff neck, dizziness, vomiting, runny nose)     Headaches at night, fatigue, left side teeth pain  8. PREGNANCY: "Is there any chance you are pregnant?" "When was your last menstrual period?"     No  Protocols used: Ear - Otitis Media Follow-up Call-A-AH

## 2023-09-25 ENCOUNTER — Encounter: Payer: Self-pay | Admitting: Family Medicine

## 2023-09-25 ENCOUNTER — Ambulatory Visit: Admitting: Family Medicine

## 2023-09-25 VITALS — BP 130/68 | HR 85 | Temp 99.4°F | Ht 60.0 in | Wt 136.8 lb

## 2023-09-25 DIAGNOSIS — H6502 Acute serous otitis media, left ear: Secondary | ICD-10-CM | POA: Diagnosis not present

## 2023-09-25 NOTE — Progress Notes (Signed)
   Subjective:    Patient ID: Desiree Yu, female    DOB: 1961-01-22, 63 y.o.   MRN: [id number]  HPI She is here for recheck.  She states that the throat is feeling much better however the left ear is still causing a great deal of difficulty.   Review of Systems     Objective:    Physical Exam Right TM and canal is normal.  Left canal is normal.  The left TM is slightly dull and thickened but not bulging.       Assessment & Plan:  Acute serous otitis media of left ear, recurrence not specified I explained that now I can see the landmarks on her tympanic membrane which is a good sign.  She is to call me when she finishes the antibiotic and if not totally back to normal I will give her more or possibly switch to a different medication if she sees no improvement from today.

## 2023-09-27 ENCOUNTER — Encounter: Payer: Self-pay | Admitting: Family Medicine

## 2023-09-27 DIAGNOSIS — H6502 Acute serous otitis media, left ear: Secondary | ICD-10-CM

## 2023-09-27 MED ORDER — AMOXICILLIN-POT CLAVULANATE 875-125 MG PO TABS: 1.0000 | ORAL_TABLET | Freq: Two times a day (BID) | ORAL | 0 refills | Status: DC

## 2023-10-10 ENCOUNTER — Encounter: Payer: Self-pay | Admitting: Family Medicine

## 2023-10-10 ENCOUNTER — Ambulatory Visit: Admitting: Family Medicine

## 2023-10-10 VITALS — BP 126/72 | HR 81 | Wt 137.2 lb

## 2023-10-10 DIAGNOSIS — H6502 Acute serous otitis media, left ear: Secondary | ICD-10-CM | POA: Diagnosis not present

## 2023-10-10 MED ORDER — LEVOFLOXACIN 500 MG PO TABS: 500.0000 mg | ORAL_TABLET | Freq: Every day | ORAL | 0 refills | Status: AC

## 2023-10-10 NOTE — Progress Notes (Signed)
   Subjective:    Patient ID: Desiree Yu, female    DOB: 1960-07-06, 63 y.o.   MRN: [id number]  HPI She is here for a recheck.  She states that she is roughly 75% better but still having some left jaw discomfort.  She would really like to get to the bottom of this to determine exactly why she keeps getting these kinds of problems.  I explained that at least at this point we definitely have a tympanic membrane that was infected.   Review of Systems     Objective:    Physical Exam Right TM and canal normal left TM is slightly dull and pinkish.       Assessment & Plan:  Non-recurrent acute serous otitis media of left ear - Plan: levofloxacin  (LEVAQUIN ) 500 MG tablet, Ambulatory referral to ENT She would like referral back to ENT which I think is certainly reasonable.  I will also switch her to Levaquin .  She had 2 doses of Augmentin  and only got 75% better.  Hopefully the Levaquin  will help.

## 2023-11-06 ENCOUNTER — Ambulatory Visit (INDEPENDENT_AMBULATORY_CARE_PROVIDER_SITE_OTHER): Admitting: Otolaryngology

## 2023-11-06 ENCOUNTER — Encounter (INDEPENDENT_AMBULATORY_CARE_PROVIDER_SITE_OTHER): Payer: Self-pay | Admitting: Otolaryngology

## 2023-11-06 VITALS — BP 149/95 | HR 86 | Ht 60.0 in | Wt 135.0 lb

## 2023-11-06 DIAGNOSIS — J343 Hypertrophy of nasal turbinates: Secondary | ICD-10-CM

## 2023-11-06 DIAGNOSIS — R0981 Nasal congestion: Secondary | ICD-10-CM

## 2023-11-06 DIAGNOSIS — J310 Chronic rhinitis: Secondary | ICD-10-CM

## 2023-11-06 DIAGNOSIS — J324 Chronic pansinusitis: Secondary | ICD-10-CM

## 2023-11-07 ENCOUNTER — Other Ambulatory Visit: Payer: Self-pay | Admitting: Family Medicine

## 2023-11-07 DIAGNOSIS — J324 Chronic pansinusitis: Secondary | ICD-10-CM | POA: Insufficient documentation

## 2023-11-07 DIAGNOSIS — G479 Sleep disorder, unspecified: Secondary | ICD-10-CM

## 2023-11-07 NOTE — Progress Notes (Signed)
 Patient ID: Desiree Yu, female   DOB: 1960-07-31, 63 y.o.   MRN: [id number]  Follow-up: Recurrent sinusitis, recurrent headaches and facial pain  HPI: The patient is a 63 year old female who returns today complaining of more recurrent sinusitis.  The patient was last seen in January 2025.  At that time, she was complaining of recurrent sinusitis and recurrent facial pain/headaches. She was evaluated by 3 different ENT physicians. Her symptoms include facial pain and pressure, nasal drainage, and nasal congestion. She typically has 4-5 episodes of sinusitis per year. She was treated with multiple courses of antibiotics and systemic/topical steroids. Her CT scans in 2022 and 2023 were all negative for significant sinusitis. She also underwent cryoablation therapy at [facility] to treat her chronic postnasal drainage.  According to the patient, she developed another episode of sinusitis in April 2025.  She was also diagnosed with tonsillitis and otitis media.  Her left eye was swollen at that time.  She was treated with 3 different courses of antibiotics.  The facial swelling and facial pain have decreased with the antibiotic treatment.  Exam: General: Communicates without difficulty, well nourished, no acute distress. Head: Normocephalic, no evidence injury, no tenderness, facial buttresses intact without stepoff. Face/sinus: No tenderness to palpation and percussion. Facial movement is normal and symmetric. Eyes: PERRL, EOMI. No scleral icterus, conjunctivae clear. Neuro: CN II exam reveals vision grossly intact.  No nystagmus at any point of gaze. Ears: Auricles well formed without lesions.  Ear canals are intact without mass or lesion.  No erythema or edema is appreciated.  The TMs are intact without fluid. Nose: External evaluation reveals normal support and skin without lesions.  Dorsum is intact.  Anterior rhinoscopy reveals congested mucosa over anterior aspect of inferior turbinates and intact septum.  No  purulence noted. Oral:  Oral cavity and oropharynx are intact, symmetric, without erythema or edema.  Mucosa is moist without lesions. Neck: Full range of motion without pain.  There is no significant lymphadenopathy.  No masses palpable.  Thyroid bed within normal limits to palpation.  Parotid glands and submandibular glands equal bilaterally without mass.  Trachea is midline. Neuro:  CN 2-12 grossly intact.   Assessment: 1.  Frequent recurrent rhinosinusitis.  However, no acute infection or purulent drainage is noted today. 2.  Diffuse nasal mucosal congestion and bilateral inferior turbinate hypertrophy. 3.  No tonsillitis or otitis media is noted today.  Plan: 1.  The physical exam findings are reviewed with the patient. 2.  Continue with Rhinocort  nasal spray and nasal saline irrigation daily. 3.  Sinus CT scan to evaluate for chronic rhinosinusitis. 4.  The patient will return for reevaluation after her CT scan.

## 2023-11-28 ENCOUNTER — Telehealth: Payer: Self-pay

## 2023-11-28 NOTE — Telephone Encounter (Signed)
 Copied from CRM [id number]. Topic: Clinical - Medical Advice >> Nov 28, 2023  4:34 PM Graeme ORN wrote: Reason for CRM: Patient called. States she has a new sinus infection. Wanted to be checked. Scheduled for next available tomorrow with Dr Randol. Patient would like provider to know because they may want to just send something in instead of her coming in since she has had the symptoms before. Would also like to be notified if there is a morning appt that opens up because she has appt for tomorrow afternoon ct scan. Thank you    ----------------------------------------------------------------------- From previous Reason for Contact - Scheduling: Patient/patient representative is calling to schedule an appointment. Refer to attachments for appointment information.

## 2023-11-29 ENCOUNTER — Ambulatory Visit (INDEPENDENT_AMBULATORY_CARE_PROVIDER_SITE_OTHER): Admitting: Family Medicine

## 2023-11-29 ENCOUNTER — Ambulatory Visit (HOSPITAL_COMMUNITY)
Admission: RE | Admit: 2023-11-29 | Discharge: 2023-11-29 | Disposition: A | Discharge status: Home / Self Care | Source: Ambulatory Visit | Attending: Otolaryngology | Admitting: Otolaryngology

## 2023-11-29 ENCOUNTER — Encounter: Payer: Self-pay | Admitting: Family Medicine

## 2023-11-29 VITALS — BP 116/70 | HR 75 | Temp 98.6°F | Wt 136.6 lb

## 2023-11-29 DIAGNOSIS — J329 Chronic sinusitis, unspecified: Secondary | ICD-10-CM | POA: Diagnosis not present

## 2023-11-29 DIAGNOSIS — J324 Chronic pansinusitis: Secondary | ICD-10-CM | POA: Insufficient documentation

## 2023-11-29 DIAGNOSIS — J342 Deviated nasal septum: Secondary | ICD-10-CM | POA: Diagnosis not present

## 2023-11-29 DIAGNOSIS — J039 Acute tonsillitis, unspecified: Secondary | ICD-10-CM | POA: Diagnosis not present

## 2023-11-29 DIAGNOSIS — J0101 Acute recurrent maxillary sinusitis: Secondary | ICD-10-CM

## 2023-11-29 LAB — POCT RAPID STREP A DIPSTICK TEST: Rapid Strep A Screen: NEGATIVE

## 2023-11-29 MED ORDER — LEVOFLOXACIN 500 MG PO TABS: 500.0000 mg | ORAL_TABLET | Freq: Every day | ORAL | 0 refills | Status: AC

## 2023-11-29 NOTE — Progress Notes (Signed)
   Subjective:    Patient ID: Macario JONELLE Matar, female    DOB: 1961-02-17, 63 y.o.   MRN: [id number]  HPI She is here for evaluation of continued difficulty with sinus trouble.  She was seen recently by ENT and is scheduled for a CT scan of her sinuses today.  She now has a several day history of frontal headache, ethmoid and maxillary sinus pressure, watery eyes and white spots on her throat but no sore throat.  She also states that she has some slight left ear discomfort.  Review of record indicates previous difficulty with otitis media   Review of Systems     Objective:    Physical Exam Alert and in no distress. Tympanic membranes and canals are normal. Pharyngeal area shows whitish lesions on both tonsils, neck is supple without adenopathy or thyromegaly.  Tender to palpation over left ethmoid and maxillary sinuses.  Cardiac exam shows a regular sinus rhythm without murmurs or gallops. Lungs are clear to auscultation.  Strep screen is negative      Assessment & Plan:  Acute recurrent maxillary sinusitis - Plan: POCT Rapid Strep A Dipstick Test, levofloxacin  (LEVAQUIN ) 500 MG tablet  Tonsillitis We will see what the CT scan shows.  I will place her back on Levaquin  which she has used in the past and discussed possible side effects of using this medication long-term in terms of diarrhea.

## 2023-12-05 DIAGNOSIS — H2513 Age-related nuclear cataract, bilateral: Secondary | ICD-10-CM | POA: Diagnosis not present

## 2023-12-05 DIAGNOSIS — H0102B Squamous blepharitis left eye, upper and lower eyelids: Secondary | ICD-10-CM | POA: Diagnosis not present

## 2023-12-05 DIAGNOSIS — H04123 Dry eye syndrome of bilateral lacrimal glands: Secondary | ICD-10-CM | POA: Diagnosis not present

## 2023-12-05 DIAGNOSIS — H02831 Dermatochalasis of right upper eyelid: Secondary | ICD-10-CM | POA: Diagnosis not present

## 2023-12-05 DIAGNOSIS — H43811 Vitreous degeneration, right eye: Secondary | ICD-10-CM | POA: Diagnosis not present

## 2023-12-05 DIAGNOSIS — H02834 Dermatochalasis of left upper eyelid: Secondary | ICD-10-CM | POA: Diagnosis not present

## 2023-12-05 DIAGNOSIS — H35371 Puckering of macula, right eye: Secondary | ICD-10-CM | POA: Diagnosis not present

## 2023-12-05 DIAGNOSIS — H0102A Squamous blepharitis right eye, upper and lower eyelids: Secondary | ICD-10-CM | POA: Diagnosis not present

## 2023-12-13 ENCOUNTER — Ambulatory Visit (INDEPENDENT_AMBULATORY_CARE_PROVIDER_SITE_OTHER): Admitting: Otolaryngology

## 2023-12-20 ENCOUNTER — Ambulatory Visit (INDEPENDENT_AMBULATORY_CARE_PROVIDER_SITE_OTHER): Admitting: Otolaryngology

## 2023-12-20 ENCOUNTER — Encounter (INDEPENDENT_AMBULATORY_CARE_PROVIDER_SITE_OTHER): Payer: Self-pay | Admitting: Otolaryngology

## 2023-12-20 VITALS — BP 153/89 | HR 76

## 2023-12-20 DIAGNOSIS — J343 Hypertrophy of nasal turbinates: Secondary | ICD-10-CM

## 2023-12-20 DIAGNOSIS — J342 Deviated nasal septum: Secondary | ICD-10-CM

## 2023-12-20 DIAGNOSIS — J310 Chronic rhinitis: Secondary | ICD-10-CM | POA: Diagnosis not present

## 2023-12-20 DIAGNOSIS — R0981 Nasal congestion: Secondary | ICD-10-CM | POA: Diagnosis not present

## 2023-12-20 DIAGNOSIS — R519 Headache, unspecified: Secondary | ICD-10-CM

## 2023-12-21 DIAGNOSIS — H0520 Unspecified exophthalmos: Secondary | ICD-10-CM | POA: Diagnosis not present

## 2023-12-21 DIAGNOSIS — R4689 Other symptoms and signs involving appearance and behavior: Secondary | ICD-10-CM | POA: Diagnosis not present

## 2023-12-21 DIAGNOSIS — H04203 Unspecified epiphora, bilateral lacrimal glands: Secondary | ICD-10-CM | POA: Diagnosis not present

## 2023-12-21 DIAGNOSIS — H579 Unspecified disorder of eye and adnexa: Secondary | ICD-10-CM | POA: Diagnosis not present

## 2023-12-21 DIAGNOSIS — H04563 Stenosis of bilateral lacrimal punctum: Secondary | ICD-10-CM | POA: Diagnosis not present

## 2023-12-21 DIAGNOSIS — J310 Chronic rhinitis: Secondary | ICD-10-CM | POA: Diagnosis not present

## 2023-12-21 DIAGNOSIS — E063 Autoimmune thyroiditis: Secondary | ICD-10-CM | POA: Diagnosis not present

## 2023-12-22 DIAGNOSIS — J342 Deviated nasal septum: Secondary | ICD-10-CM | POA: Insufficient documentation

## 2023-12-22 NOTE — Progress Notes (Signed)
 Patient ID: Desiree Yu, female   DOB: 1960/12/18, 63 y.o.   MRN: [id number]  Follow-up: Chronic nasal obstruction, chronic facial pain and pressure  HPI: The patient is a 63 year old female who returns today for her follow-up evaluation.  The patient was previously seen for chronic nasal obstruction, recurrent sinusitis, and recurrent facial pain and pressure.  She was previously treated with multiple courses of antibiotics, systemic steroid, topical steroid sprays, allergy  medications, and nasal irrigation.  Despite the treatment, she continues to be symptomatic.  She recently underwent a sinus CT scan.  The CT showed no significant acute or chronic sinusitis.  However, she is noted to have severe nasal septal deviation and bilateral inferior turbinate hypertrophy.  The patient returns today complaining of persistent nasal obstruction and facial pressure.  She denies any fever or visual change.  Exam: General: Communicates without difficulty, well nourished, no acute distress. Head: Normocephalic, no evidence injury, no tenderness, facial buttresses intact without stepoff. Face/sinus: No tenderness to palpation and percussion. Facial movement is normal and symmetric. Eyes: PERRL, EOMI. No scleral icterus, conjunctivae clear. Neuro: CN II exam reveals vision grossly intact.  No nystagmus at any point of gaze. Ears: Auricles well formed without lesions.  Ear canals are intact without mass or lesion.  No erythema or edema is appreciated.  The TMs are intact without fluid. Nose: External evaluation reveals normal support and skin without lesions.  Dorsum is intact.  Anterior rhinoscopy reveals congested mucosa over anterior aspect of inferior turbinates and deviated septum.  No purulence noted. Oral:  Oral cavity and oropharynx are intact, symmetric, without erythema or edema.  Mucosa is moist without lesions. Neck: Full range of motion without pain.  There is no significant lymphadenopathy.  No masses  palpable.  Thyroid bed within normal limits to palpation.  Parotid glands and submandibular glands equal bilaterally without mass.  Trachea is midline. Neuro:  CN 2-12 grossly intact.   Assessment: 1.  Chronic rhinitis with nasal mucosal congestion, severe nasal septal deviation, and bilateral inferior turbinate hypertrophy.  More than 95% of her nasal passageways are obstructed bilaterally.  The patient has not responded to medical treatment for the past year. 2.  No suspicious mass, lesion, or purulent drainage is noted today.  Plan: 1.  The physical exam findings and the CT images are extensively reviewed with the patient. 2.  The patient is reassured and no significant sinusitis is noted. 3.  Continue with Rhinocort  nasal spray and nasal saline irrigation daily. 4.  In light of her persistent symptoms, she may benefit from surgical intervention with septoplasty and bilateral turbinate reduction.  The risk, benefits, alternatives, and details of the procedures are extensively discussed.  Questions are invited and answered. 5.  The patient would like to proceed with the procedures.  We will schedule the procedures in accordance with the patient's schedule.

## 2024-01-14 DIAGNOSIS — R519 Headache, unspecified: Secondary | ICD-10-CM | POA: Diagnosis not present

## 2024-01-14 DIAGNOSIS — J310 Chronic rhinitis: Secondary | ICD-10-CM | POA: Diagnosis not present

## 2024-01-14 DIAGNOSIS — H04203 Unspecified epiphora, bilateral lacrimal glands: Secondary | ICD-10-CM | POA: Diagnosis not present

## 2024-01-14 DIAGNOSIS — H04563 Stenosis of bilateral lacrimal punctum: Secondary | ICD-10-CM | POA: Diagnosis not present

## 2024-01-14 DIAGNOSIS — R4689 Other symptoms and signs involving appearance and behavior: Secondary | ICD-10-CM | POA: Diagnosis not present

## 2024-01-14 DIAGNOSIS — H0520 Unspecified exophthalmos: Secondary | ICD-10-CM | POA: Diagnosis not present

## 2024-01-14 DIAGNOSIS — H21233 Degeneration of iris (pigmentary), bilateral: Secondary | ICD-10-CM | POA: Diagnosis not present

## 2024-01-15 ENCOUNTER — Emergency Department (HOSPITAL_COMMUNITY)
Admission: EM | Admit: 2024-01-15 | Discharge: 2024-01-15 | Disposition: A | Discharge status: Home / Self Care | Attending: Emergency Medicine | Admitting: Emergency Medicine

## 2024-01-15 ENCOUNTER — Other Ambulatory Visit: Payer: Self-pay

## 2024-01-15 ENCOUNTER — Encounter (HOSPITAL_COMMUNITY): Payer: Self-pay

## 2024-01-15 DIAGNOSIS — I10 Essential (primary) hypertension: Secondary | ICD-10-CM | POA: Insufficient documentation

## 2024-01-15 DIAGNOSIS — J343 Hypertrophy of nasal turbinates: Secondary | ICD-10-CM | POA: Diagnosis not present

## 2024-01-15 DIAGNOSIS — R040 Epistaxis: Secondary | ICD-10-CM | POA: Diagnosis not present

## 2024-01-15 DIAGNOSIS — R000 Tachycardia, unspecified: Secondary | ICD-10-CM | POA: Diagnosis not present

## 2024-01-15 DIAGNOSIS — J342 Deviated nasal septum: Secondary | ICD-10-CM | POA: Diagnosis not present

## 2024-01-15 DIAGNOSIS — Z79899 Other long term (current) drug therapy: Secondary | ICD-10-CM | POA: Diagnosis not present

## 2024-01-15 DIAGNOSIS — J3489 Other specified disorders of nose and nasal sinuses: Secondary | ICD-10-CM | POA: Diagnosis not present

## 2024-01-15 HISTORY — PX: OTHER SURGICAL HISTORY: SHX169

## 2024-01-15 LAB — I-STAT CHEM 8, ED
BUN: 20 mg/dL (ref 8–23)
Calcium, Ion: 0.99 mmol/L — ABNORMAL LOW (ref 1.15–1.40)
Chloride: 111 mmol/L (ref 98–111)
Creatinine, Ser: 0.6 mg/dL (ref 0.44–1.00)
Glucose, Bld: 197 mg/dL — ABNORMAL HIGH (ref 70–99)
HCT: 34 % — ABNORMAL LOW (ref 36.0–46.0)
Hemoglobin: 11.6 g/dL — ABNORMAL LOW (ref 12.0–15.0)
Potassium: 4.4 mmol/L (ref 3.5–5.1)
Sodium: 136 mmol/L (ref 135–145)
TCO2: 20 mmol/L — ABNORMAL LOW (ref 22–32)

## 2024-01-15 LAB — CBC
HCT: 36 % (ref 36.0–46.0)
Hemoglobin: 11.9 g/dL — ABNORMAL LOW (ref 12.0–15.0)
MCH: 31.8 pg (ref 26.0–34.0)
MCHC: 33.1 g/dL (ref 30.0–36.0)
MCV: 96.3 fL (ref 80.0–100.0)
Platelets: 413 K/uL — ABNORMAL HIGH (ref 150–400)
RBC: 3.74 MIL/uL — ABNORMAL LOW (ref 3.87–5.11)
RDW: 13.2 % (ref 11.5–15.5)
WBC: 10.5 K/uL (ref 4.0–10.5)
nRBC: 0 % (ref 0.0–0.2)

## 2024-01-15 MED ORDER — SODIUM CHLORIDE 0.9 % IV BOLUS
1000.0000 mL | Freq: Once | INTRAVENOUS | Status: AC
  Administered 2024-01-15: 1000 mL via INTRAVENOUS

## 2024-01-15 MED ORDER — OXYMETAZOLINE HCL 0.05 % NA SOLN
1.0000 | Freq: Once | NASAL | Status: AC
  Administered 2024-01-15: 1 via NASAL
  Filled 2024-01-15: qty 30

## 2024-01-15 NOTE — Discharge Instructions (Signed)
 Thank you for letting us  evaluate you today.  Your blood count is mildly decreased at 12 1 normal is above 13.  This is likely secondary to your recent procedure, nosebleed.  Please make sure to take care to not cause another nosebleed.  Try and keep heart rate, blood pressure down by not being physically active.  You may follow-up with your ENT specialist tomorrow.  I have also given you fluids for soft/slightly low BP, hydration resolving this.  If you experience another nosebleed please make sure to lean forward, pinch your nose for 15-30 minutes without checking if it is still bleeding.  Return to Emergency Department if you experience severe bleeding that does not stop despite this, bleeding down the back of throat, dizziness, lightheadedness

## 2024-01-15 NOTE — ED Triage Notes (Signed)
 Patient presents to ED today with a nose bleed that started this morning after surgery this morning. Patient states the bleeding has been off and on and then started gushing blood. PT states that the right nostril is bleeding worse than the left. Patient had a bilateral sinoplasty.

## 2024-01-15 NOTE — ED Notes (Signed)
Afrin at bedside 

## 2024-01-15 NOTE — ED Notes (Signed)
 Patient stating she is dizzy, and hot. Patient looks pale and diaphoretic. She is unable to lay flat after her surgery this morning

## 2024-01-15 NOTE — ED Provider Notes (Signed)
 [place] EMERGENCY DEPARTMENT AT [facility] Provider Note   CSN: [id number] Arrival date & time: 01/15/24  1503     Patient presents with: Epistaxis   Desiree Yu is a 63 y.o. female with past medical history of hiatal hernia, HTN, chronic sinusitis, deviated nasal septum, chronic rhinitis presents to Emergency Department for evaluation of bilateral epistaxis that started this morning at 0900 following surgery.  Reports that she started gushing blood with clots out of both nostrils but right worse than left so sought ED evaluation. Has ENT f/u on Friday, in four days. No thinners, traumatic injury  Was evaluated by Dr. Karis with [facility] on 12/20/2023 who recommended septoplasty and bilateral turbinate reduction. Per pt, had bilateral septoplasty today at 0900    Epistaxis      Prior to Admission medications   Medication Sig Start Date End Date Taking? Authorizing Provider  ALPRAZolam  (XANAX ) 0.25 MG tablet Take 1 tablet (0.25 mg total) by mouth as needed for anxiety. 08/07/23   Lalonde, John C, MD  amoxicillin -clavulanate (AUGMENTIN ) 875-125 MG tablet Take 1 tablet by mouth 2 (two) times daily. Patient not taking: Reported on 12/20/2023 09/27/23   Lalonde, John C, MD  Ascorbic Acid (VITAMIN C) 500 MG CAPS as directed Orally    [provider]  atorvastatin  (LIPITOR) 20 MG tablet TAKE 1 TABLET(20 MG) BY MOUTH DAILY 09/13/23   Joyce Norleen BROCKS, MD  budesonide  (PULMICORT ) 0.5 MG/2ML nebulizer solution Mix one vial into the [facility] bottle and instill through each nostril once daily. 03/29/23   Iva Marty Saltness, MD  Coenzyme Q10 (CO Q 10 PO) Take 1 capsule by mouth.    [provider]  COLLAGEN PO Take 6 tablets by mouth daily.    [provider]  fish oil-omega-3 fatty acids 1000 MG capsule Take 1,400 mg by mouth daily.    [provider]  hydrochlorothiazide  (HYDRODIURIL ) 12.5 MG tablet TAKE 1 TABLET(12.5 MG) BY MOUTH DAILY 09/13/23    Joyce Norleen BROCKS, MD  loratadine-pseudoephedrine (CLARITIN-D 24-HOUR) 10-240 MG 24 hr tablet Take 1 tablet by mouth daily.    [provider]  losartan  (COZAAR ) 50 MG tablet Take 1 tablet (50 mg total) by mouth daily. 09/13/23   Lalonde, John C, MD  Multiple Vitamins-Minerals (MULTIVITAMIN WITH MINERALS) tablet Take 1 tablet by mouth daily.    [provider]  OVER THE COUNTER MEDICATION Zyflamend    [provider]  Sodium Chloride -Xylitol (XLEAR SINUS CARE SPRAY NA)  06/23/20   [provider]  traZODone  (DESYREL ) 50 MG tablet TAKE 1 TABLET(50 MG) BY MOUTH AT BEDTIME AS NEEDED FOR SLEEP 11/07/23   Lalonde, John C, MD  Turmeric 500 MG CAPS 2 capsules Orally once a day    [provider]    Allergies: E-mycin [erythromycin base]    Review of Systems  HENT:  Positive for nosebleeds.     Updated Vital Signs BP 109/62 (BP Location: Right Arm)   Pulse 100   Temp (!) 97.4 F (36.3 C) (Oral)   Resp 18   Ht 5' (1.524 m)   Wt 61.2 kg   SpO2 97%   BMI 26.37 kg/m   Physical Exam Vitals and nursing note reviewed.  Constitutional:      General: She is not in acute distress.    Appearance: Normal appearance.  HENT:     Head: Normocephalic and atraumatic.     Nose:     Comments: Dried clotted epistaxis to  bilateral nostrils. No active bleeding Eyes:     Conjunctiva/sclera: Conjunctivae normal.  Cardiovascular:     Rate and Rhythm: Tachycardia present.     Comments: Was initially 129 bpm and decreased to 100 bpm following IVF, rest Pulmonary:     Effort: Pulmonary effort is normal. No respiratory distress.     Breath sounds: Normal breath sounds.  Skin:    Coloration: Skin is not jaundiced or pale.  Neurological:     Mental Status: She is alert. Mental status is at baseline.     (all labs ordered are listed, but only abnormal results are displayed) Labs Reviewed  CBC - Abnormal; Notable for the following components:      Result Value    RBC 3.74 (*)    Hemoglobin 11.9 (*)    Platelets 413 (*)    All other components within normal limits  I-STAT CHEM 8, ED - Abnormal; Notable for the following components:   Glucose, Bld 197 (*)    Calcium , Ion 0.99 (*)    TCO2 20 (*)    Hemoglobin 11.6 (*)    HCT 34.0 (*)    All other components within normal limits    EKG: None  Radiology: No results found.   Medications Ordered in the ED  oxymetazoline  (AFRIN) 0.05 % nasal spray 1 spray (1 spray Each Nare Provided for home use 01/15/24 1849)  sodium chloride  0.9 % bolus 1,000 mL (0 mLs Intravenous Stopped 01/15/24 1849)                                    Medical Decision Making Amount and/or Complexity of Data Reviewed Labs: ordered.  Risk OTC drugs.   Patient presents to the ED for concern of epistaxis, this involves an extensive number of treatment options, and is a complaint that carries with it a high risk of complications and morbidity.  The differential diagnosis includes postsurgical epistaxis, traumatic injury, symptomatic anemia   Co morbidities that complicate the patient evaluation  Had bilateral septoplasty this morning   Additional history obtained:  Additional history obtained from Nursing and Outside Medical Records   External records from outside source obtained and reviewed including triage RN note, ENT note   Lab Tests:  I Ordered, and personally interpreted labs.  The pertinent results include:   Hgb 11.9 PLT 413    Medicines ordered and prescription drug management:  I ordered medication including afrin  for PRN prescription  I have reviewed the patients home medicines and have made adjustments as needed    Consultations Obtained:  I requested consultation with the Dr. Roark ENT,  and discussed lab and imaging findings as well as pertinent plan - they recommend:  Recommended against having patient blow out clots as it is currently maintaining epistaxis unless patient has posterior  bleeding or bleeding down back of throat or bleeding is not controlled Appear stable at this time and can be evaluated outpatient Return precautions (if bleeding through >4 gauzes/hr within 24hrs post surgery or continued significant bleeding >24hrs)   Problem List / ED Course:  Epistaxis No thinners Watched patient 4 hours post arrival to ED with no active epistaxis Denies bleeding in back of throat, nausea, vomiting.  No bleeding in back of throat visualized so bleeding is likely secondary to surgery, anterior bleeding No hypotension.  Was mildly tachycardic at 129 bpm which decreased to 100 bpm following IVF, rest.  Hgb 11.9.  Normally 13-14 over past 4 years but likely secondary to recent surgery, epistaxis.  Denies dizziness, lightheadedness Did send patient home with Afrin to use as needed for epistaxis.  Discussed how to control epistaxis verbally with patient (by blowing out all clots, using afrin 1-2 sprays per affected nostril, leaning forward, pinching at bridge of nose for 15-30 minutes without checking to see if bleeding has stopped) and provided these recommendations on DC paperwork Discussed that patient can follow-up at outpatient appointment this Friday but may also call earlier if she feels that she needs to be evaluated tomorrow Discussed strict return to ED precautions   Reevaluation:  After the interventions noted above, I reevaluated the patient and found that they have :improved    Dispostion:  After consideration of the diagnostic results and the patients response to treatment, I feel that the patent would benefit from outpatient management with ENT follow-up.   Discussed ED workup, disposition, return to ED precautions with patient who expresses understanding agrees with plan.  All questions answered to their satisfaction.  They are agreeable to plan.  Discharge instructions provided on paperwork  Final diagnoses:  Epistaxis    ED Discharge Orders     None         Minnie Tinnie BRAVO, PA 01/16/24 1507    Dreama Longs, MD 01/20/24 [phone]

## 2024-01-16 ENCOUNTER — Telehealth (INDEPENDENT_AMBULATORY_CARE_PROVIDER_SITE_OTHER): Payer: Self-pay | Admitting: Otolaryngology

## 2024-01-16 ENCOUNTER — Ambulatory Visit (INDEPENDENT_AMBULATORY_CARE_PROVIDER_SITE_OTHER): Admitting: Otolaryngology

## 2024-01-16 VITALS — BP 148/85 | HR 89

## 2024-01-16 DIAGNOSIS — J310 Chronic rhinitis: Secondary | ICD-10-CM

## 2024-01-16 DIAGNOSIS — Z9889 Other specified postprocedural states: Secondary | ICD-10-CM

## 2024-01-16 NOTE — Telephone Encounter (Signed)
 Patient called and stated she had Sx for Septo/Turbs yesterday, 01/15/2024 and started gushing blood.  She stated that she called our office and did not receive a call back so she went to the ED.  The ED told her she needed to follow up with Dr. Karis today.  She has been scheduled to see him today, 01/16/2024 @ 3:20pm.

## 2024-01-17 NOTE — Progress Notes (Signed)
 The patient had some postop bleeding yesterday.  She was evaluated at the [facility] emergency room and was discharged home without any intervention.  Doyle splints removed. Septum and turbinates are healing well.   Both [place] debrided.  Nasal saline irrigation.  Recheck in 3 weeks.

## 2024-01-18 ENCOUNTER — Encounter (INDEPENDENT_AMBULATORY_CARE_PROVIDER_SITE_OTHER): Admitting: Otolaryngology

## 2024-01-21 ENCOUNTER — Ambulatory Visit (HOSPITAL_COMMUNITY)
Admission: EM | Admit: 2024-01-21 | Discharge: 2024-01-21 | Disposition: A | Discharge status: Home / Self Care | Attending: Family Medicine | Admitting: Family Medicine

## 2024-01-21 ENCOUNTER — Ambulatory Visit (INDEPENDENT_AMBULATORY_CARE_PROVIDER_SITE_OTHER): Admitting: Otolaryngology

## 2024-01-21 ENCOUNTER — Encounter (HOSPITAL_COMMUNITY): Payer: Self-pay | Admitting: *Deleted

## 2024-01-21 DIAGNOSIS — H6691 Otitis media, unspecified, right ear: Secondary | ICD-10-CM | POA: Diagnosis not present

## 2024-01-21 MED ORDER — KETOROLAC TROMETHAMINE 30 MG/ML IJ SOLN: 30.0000 mg | Freq: Once | INTRAMUSCULAR | Status: DC

## 2024-01-21 MED ORDER — KETOROLAC TROMETHAMINE 30 MG/ML IJ SOLN
INTRAMUSCULAR | Status: AC
  Filled 2024-01-21: qty 1

## 2024-01-21 MED ORDER — IBUPROFEN 600 MG PO TABS: 600.0000 mg | ORAL_TABLET | Freq: Three times a day (TID) | ORAL | 0 refills | Status: DC | PRN

## 2024-01-21 MED ORDER — KETOROLAC TROMETHAMINE 10 MG PO TABS: 10.0000 mg | ORAL_TABLET | Freq: Four times a day (QID) | ORAL | 0 refills | Status: DC | PRN

## 2024-01-21 MED ORDER — TRAMADOL HCL 50 MG PO TABS: 50.0000 mg | ORAL_TABLET | Freq: Four times a day (QID) | ORAL | 0 refills | Status: DC | PRN

## 2024-01-21 MED ORDER — AMOXICILLIN-POT CLAVULANATE 875-125 MG PO TABS: 1.0000 | ORAL_TABLET | Freq: Two times a day (BID) | ORAL | 0 refills | Status: AC

## 2024-01-21 NOTE — ED Notes (Signed)
 Called pharmacy [facility] and D/C tordal and advil .

## 2024-01-21 NOTE — Discharge Instructions (Addendum)
   Take amoxicillin -clavulanate 875 mg--1 tab twice daily with food for 7 days  Take tramadol  50 mg-- 1 tablet every 6 hours as needed for pain.  This medication can make you sleepy or dizzy   Keep your follow-up with your ENT specialist.

## 2024-01-21 NOTE — ED Triage Notes (Addendum)
 Pt states she has right ear pain since yesterday. She did have surgery for her Septo/Turbs 01/15/2024. She has had her post op visit but states she is having a lot of drainage. She has been taking tylenol  and saline spray. She has used afrin if she feels she is bleeding too much, last use was this morning.   She called ENT this morning but they wanted her to wait until her appt tomorrow

## 2024-01-21 NOTE — ED Provider Notes (Addendum)
 [facility]    CSN: [id number] Arrival date & time: 01/21/24  9047      History   Chief Complaint Chief Complaint  Patient presents with   Otalgia    HPI Desiree Yu is a 63 y.o. female.    Otalgia Here for right ear pain and drainage.  Symptoms began yesterday.  Tylenol  has not been helping.  On September night she had sinus surgery.  She has had her postop visit.  No fever or headache or upper respiratory congestion.  She is allergic to erythromycin  Past Medical History:  Diagnosis Date   Allergy     RHINITIS   Anxiety    Hemorrhoids    Hx of colonic polyps    IBS (irritable bowel syndrome)    Right ear pain     Patient Active Problem List   Diagnosis Date Noted   Deviated nasal septum 12/22/2023   Chronic pansinusitis 11/07/2023   Chronic rhinitis 06/07/2023   Hypertrophy of nasal turbinates 06/07/2023   Facial pain 06/07/2023   Essential hypertension 08/30/2022   H/O hiatal hernia 07/07/2021   Family history of colonic polyps 04/05/2016   Allergic rhinitis due to pollen 11/11/2010   Hyperlipidemia with target LDL less than 100 11/11/2010   TALIPES CAVUS 03/08/2009    Past Surgical History:  Procedure Laterality Date   COLONOSCOPY  2011   Dr. rosalie   Sinuplasty with stents  01/15/2024   SKIN BIOPSY Left 02/04/2020   skin shave biopsy, left anterior leg Verruca vulgaris   SKIN BIOPSY Left 10/11/2021   postsurgical scar no neoplasm   skin ex Left 03/05/2020   SKin excision left anterior thorax: post surgical scar, No neoplasm is identiied    OB History   No obstetric history on file.      Home Medications    Prior to Admission medications   Medication Sig Start Date End Date Taking? Authorizing Provider  amoxicillin -clavulanate (AUGMENTIN ) 875-125 MG tablet Take 1 tablet by mouth 2 (two) times daily for 7 days. 01/21/24 01/28/24 Yes Vonna Sharlet POUR, MD  atorvastatin  (LIPITOR) 20 MG tablet TAKE 1 TABLET(20 MG) BY MOUTH DAILY  09/13/23  Yes Joyce Norleen BROCKS, MD  losartan  (COZAAR ) 50 MG tablet Take 1 tablet (50 mg total) by mouth daily. 09/13/23  Yes Joyce Norleen BROCKS, MD  Sodium Chloride -Xylitol ALEAN SINUS CARE SPRAY NA)  06/23/20  Yes [provider]  traMADol  (ULTRAM ) 50 MG tablet Take 1 tablet (50 mg total) by mouth every 6 (six) hours as needed (pain). 01/21/24  Yes Vonna Sharlet POUR, MD  ALPRAZolam  (XANAX ) 0.25 MG tablet Take 1 tablet (0.25 mg total) by mouth as needed for anxiety. 08/07/23   Lalonde, John C, MD  Ascorbic Acid (VITAMIN C) 500 MG CAPS as directed Orally    [provider]  budesonide  (PULMICORT ) 0.5 MG/2ML nebulizer solution Mix one vial into the [employer] bottle and instill through each nostril once daily. 03/29/23   Iva Marty Saltness, MD  Coenzyme Q10 (CO Q 10 PO) Take 1 capsule by mouth.    [provider]  COLLAGEN PO Take 6 tablets by mouth daily.    [provider]  fish oil-omega-3 fatty acids 1000 MG capsule Take 1,400 mg by mouth daily.    [provider]  hydrochlorothiazide  (HYDRODIURIL ) 12.5 MG tablet TAKE 1 TABLET(12.5 MG) BY MOUTH DAILY 09/13/23   Joyce Norleen BROCKS, MD  loratadine-pseudoephedrine (CLARITIN-D 24-HOUR) 10-240 MG 24 hr tablet Take 1 tablet by mouth  daily.    [provider]  Multiple Vitamins-Minerals (MULTIVITAMIN WITH MINERALS) tablet Take 1 tablet by mouth daily.    [provider]  OVER THE COUNTER MEDICATION Zyflamend    [provider]  traZODone  (DESYREL ) 50 MG tablet TAKE 1 TABLET(50 MG) BY MOUTH AT BEDTIME AS NEEDED FOR SLEEP 11/07/23   Joyce Norleen BROCKS, MD  Turmeric 500 MG CAPS 2 capsules Orally once a day    [provider]    Family History Family History  Problem Relation Age of Onset   Hypertension Mother    Arthritis Father    Stroke Maternal Uncle    Stroke Maternal Grandfather     Social History Social History   Tobacco Use   Smoking status: Never   Smokeless tobacco: Never   Vaping Use   Vaping status: Never Used  Substance Use Topics   Alcohol use: Yes    Alcohol/week: 7.0 standard drinks of alcohol    Types: 7 Glasses of wine per week    Comment: 1 glass of wine a day   Drug use: No     Allergies   E-mycin [erythromycin base]   Review of Systems Review of Systems  HENT:  Positive for ear pain.      Physical Exam Triage Vital Signs ED Triage Vitals  Encounter Vitals Group     BP 01/21/24 1057 (!) 140/80     Girls Systolic BP Percentile --      Girls Diastolic BP Percentile --      Boys Systolic BP Percentile --      Boys Diastolic BP Percentile --      Pulse Rate 01/21/24 1057 81     Resp 01/21/24 1057 16     Temp 01/21/24 1057 98.2 F (36.8 C)     Temp Source 01/21/24 1057 Oral     SpO2 01/21/24 1057 99 %     Weight --      Height --      Head Circumference --      Peak Flow --      Pain Score 01/21/24 1054 9     Pain Loc --      Pain Education --      Exclude from Growth Chart --    No data found.  Updated Vital Signs BP (!) 140/80 (BP Location: Right Arm)   Pulse 81   Temp 98.2 F (36.8 C) (Oral)   Resp 16   SpO2 99%   Visual Acuity Right Eye Distance:   Left Eye Distance:   Bilateral Distance:    Right Eye Near:   Left Eye Near:    Bilateral Near:     Physical Exam Vitals reviewed.  Constitutional:      General: She is not in acute distress.    Appearance: She is not ill-appearing, toxic-appearing or diaphoretic.  HENT:     Right Ear: Ear canal and external ear normal.     Left Ear: Tympanic membrane, ear canal and external ear normal.     Ears:     Comments: The posterior portion of the right tympanic membrane is gray and shiny but with altered landmarks.  Anterior portion has a white area about half a centimeter in length by 2 mm in width.  There is a little bit of blood mixed in.    Mouth/Throat:     Mouth: Mucous membranes are moist.  Eyes:     Extraocular Movements: Extraocular movements intact.  Conjunctiva/sclera: Conjunctivae normal.     Pupils: Pupils are equal, round, and reactive to light.  Musculoskeletal:     Cervical back: Neck supple.  Lymphadenopathy:     Cervical: No cervical adenopathy.  Skin:    Coloration: Skin is not pale.  Neurological:     General: No focal deficit present.     Mental Status: She is alert and oriented to person, place, and time.  Psychiatric:        Behavior: Behavior normal.      UC Treatments / Results  Labs (all labs ordered are listed, but only abnormal results are displayed) Labs Reviewed - No data to display  EKG   Radiology No results found.  Procedures Procedures (including critical care time)  Medications Ordered in UC Medications  ketorolac  (TORADOL ) 30 MG/ML injection 30 mg (30 mg Intramuscular Patient Refused/Not Given 01/21/24 1200)    Initial Impression / Assessment and Plan / UC Course  I have reviewed the triage vital signs and the nursing notes.  Pertinent labs & imaging results that were available during my care of the patient were reviewed by me and considered in my medical decision making (see chart for details).     She shows me also discharge on the tissue that is come out of her ear and it is mucoid and has streaks of blood in it.  Apparently this is an otitis media with perforation.  Augmentin  is sent in to treat along with some Toradol  tablets and Toradol  injection is given here.  Last eGFR is 101.  Patient ended up declining the Toradol  and ibuprofen  was sent instead.  Then patient states her ENT specialist said not to take ibuprofen  either.  Ibuprofen  is canceled and tramadol  was sent instead very very small quantity.  She has follow-up with ENT in the next 24 hours. Final Clinical Impressions(s) / UC Diagnoses   Final diagnoses:  Right otitis media, unspecified otitis media type     Discharge Instructions        Take amoxicillin -clavulanate 875 mg--1 tab twice daily with food for 7  days  Take tramadol  50 mg-- 1 tablet every 6 hours as needed for pain.  This medication can make you sleepy or dizzy   Keep your follow-up with your ENT specialist.       ED Prescriptions     Medication Sig Dispense Auth. Provider   amoxicillin -clavulanate (AUGMENTIN ) 875-125 MG tablet Take 1 tablet by mouth 2 (two) times daily for 7 days. 14 tablet Haidar Muse K, MD   ketorolac  (TORADOL ) 10 MG tablet  (Status: Discontinued) Take 1 tablet (10 mg total) by mouth every 6 (six) hours as needed (pain). 20 tablet Lillianah Swartzentruber, Sharlet POUR, MD   ibuprofen  (ADVIL ) 600 MG tablet  (Status: Discontinued) Take 1 tablet (600 mg total) by mouth every 8 (eight) hours as needed (pain). 15 tablet Darald Uzzle K, MD   ibuprofen  (ADVIL ) 600 MG tablet  (Status: Discontinued) Take 1 tablet (600 mg total) by mouth every 8 (eight) hours as needed (pain). 15 tablet Gibbs Naugle, Sharlet POUR, MD   traMADol  (ULTRAM ) 50 MG tablet Take 1 tablet (50 mg total) by mouth every 6 (six) hours as needed (pain). 4 tablet Eriyana Sweeten, Sharlet POUR, MD      I have reviewed the PDMP during this encounter.   Vonna Sharlet POUR, MD 01/21/24 1154    Vonna Sharlet POUR, MD 01/21/24 [phone]

## 2024-01-22 ENCOUNTER — Ambulatory Visit (HOSPITAL_COMMUNITY)

## 2024-01-22 ENCOUNTER — Encounter (INDEPENDENT_AMBULATORY_CARE_PROVIDER_SITE_OTHER): Payer: Self-pay | Admitting: Otolaryngology

## 2024-01-22 ENCOUNTER — Ambulatory Visit (INDEPENDENT_AMBULATORY_CARE_PROVIDER_SITE_OTHER): Admitting: Otolaryngology

## 2024-01-22 VITALS — BP 120/75 | HR 88

## 2024-01-22 DIAGNOSIS — H6691 Otitis media, unspecified, right ear: Secondary | ICD-10-CM | POA: Diagnosis not present

## 2024-01-23 DIAGNOSIS — H6691 Otitis media, unspecified, right ear: Secondary | ICD-10-CM | POA: Insufficient documentation

## 2024-01-23 NOTE — Progress Notes (Signed)
 Patient ID: Desiree Yu, female   DOB: 05-10-1960, 63 y.o.   MRN: [id number]  New complaint: Right ear infection, right ear pain  HPI: The patient is a 63 year old female who presents today with a new complaint of right ear infection and right ear pain.  The patient was previously seen for chronic nasal obstruction.  She was treated with septoplasty and bilateral turbinate reduction.  According to the patient, she started experiencing an acute right ear pain 2 days ago.  She was evaluated at an urgent care center, and was diagnosed with right otitis media.  She was treated with Augmentin .  The right ear pain has improved.  She denies any fever.  Exam: General: Communicates without difficulty, well nourished, no acute distress. Head: Normocephalic, no evidence injury, no tenderness, facial buttresses intact without stepoff. Face/sinus: No tenderness to palpation and percussion. Facial movement is normal and symmetric. Eyes: PERRL, EOMI. No scleral icterus, conjunctivae clear. Neuro: CN II exam reveals vision grossly intact.  No nystagmus at any point of gaze. Ears: Auricles well formed without lesions.  Ear canals are intact without mass or lesion.  The right tympanic membrane is edematous and erythematous.  Nose: External evaluation reveals normal support and skin without lesions.  Dorsum is intact.  Anterior rhinoscopy reveals congested mucosa over anterior aspect of inferior turbinates and intact septum.  No purulence or bleeding noted. Oral:  Oral cavity and oropharynx are intact, symmetric, without erythema or edema.  Mucosa is moist without lesions. Neck: Full range of motion without pain.  There is no significant lymphadenopathy.  No masses palpable.  Thyroid bed within normal limits to palpation.  Parotid glands and submandibular glands equal bilaterally without mass.  Trachea is midline. Neuro:  CN 2-12 grossly intact.   Assessment: 1.  Acute right otitis media with erythematous and edematous  right tympanic membrane.  Plan: 1.  The physical exam findings are reviewed with the patient. 2.  Complete her current course of Augmentin . 3.  The patient will return for reevaluation in 2 weeks.

## 2024-02-07 ENCOUNTER — Encounter (INDEPENDENT_AMBULATORY_CARE_PROVIDER_SITE_OTHER): Payer: Self-pay | Admitting: Otolaryngology

## 2024-02-07 ENCOUNTER — Ambulatory Visit (INDEPENDENT_AMBULATORY_CARE_PROVIDER_SITE_OTHER): Admitting: Otolaryngology

## 2024-02-07 VITALS — BP 136/77 | HR 80 | Temp 97.9°F

## 2024-02-07 DIAGNOSIS — Z09 Encounter for follow-up examination after completed treatment for conditions other than malignant neoplasm: Secondary | ICD-10-CM

## 2024-02-07 DIAGNOSIS — H6691 Otitis media, unspecified, right ear: Secondary | ICD-10-CM

## 2024-02-07 DIAGNOSIS — Z8669 Personal history of other diseases of the nervous system and sense organs: Secondary | ICD-10-CM

## 2024-02-08 NOTE — Progress Notes (Addendum)
 Patient ID: Desiree Yu, female   DOB: 04-24-1961, 63 y.o.   MRN: [id number]  Follow-up of new complaint from 01/22/24: Right ear infection, right ear pain  HPI: The patient is a 63 year old female who returns today for her follow-up evaluation.  The patient presented 2 weeks ago with a new complaint of right ear infection and right ear pain.  She was noted to have an acute right otitis media with erythematous and edematous right tympanic membrane.  She was treated with Augmentin .  The patient returns today reporting improvement in her right ear symptoms.  Currently she denies any otalgia, otorrhea, or hearing difficulty.  Exam: General: Communicates without difficulty, well nourished, no acute distress. Head: Normocephalic, no evidence injury, no tenderness, facial buttresses intact without stepoff. Face/sinus: No tenderness to palpation and percussion. Facial movement is normal and symmetric. Eyes: PERRL, EOMI. No scleral icterus, conjunctivae clear. Neuro: CN II exam reveals vision grossly intact.  No nystagmus at any point of gaze. Ears: Auricles well formed without lesions.  Ear canals are intact without mass or lesion.  Both tympanic membranes are intact and mobile.  No infection is noted.  Nose: External evaluation reveals normal support and skin without lesions.  Dorsum is intact.  Anterior rhinoscopy reveals congested mucosa over anterior aspect of inferior turbinates and intact septum.  No purulence or bleeding noted. Oral:  Oral cavity and oropharynx are intact, symmetric, without erythema or edema.  Mucosa is moist without lesions. Neck: Full range of motion without pain.  There is no significant lymphadenopathy.  No masses palpable.  Thyroid bed within normal limits to palpation.  Parotid glands and submandibular glands equal bilaterally without mass.  Trachea is midline. Neuro:  CN 2-12 grossly intact.    Assessment: 1.  The patient's acute right otitis media has improved.  Both tympanic  membranes and middle ear spaces are noted to be intact and mobile. The erythema has decreased.  Plan: 1.  The physical exam findings are reviewed with the patient. 2.  She is reassured that the infection has significantly improved. 3.  No other intervention is needed. 4.  The patient will return for reevaluation in 6 months, sooner if needed.

## 2024-02-22 ENCOUNTER — Encounter: Payer: Self-pay | Admitting: *Deleted

## 2024-02-25 ENCOUNTER — Encounter: Payer: Self-pay | Admitting: *Deleted

## 2024-02-25 ENCOUNTER — Ambulatory Visit (HOSPITAL_BASED_OUTPATIENT_CLINIC_OR_DEPARTMENT_OTHER): Admitting: Cardiovascular Disease

## 2024-02-25 ENCOUNTER — Encounter (HOSPITAL_BASED_OUTPATIENT_CLINIC_OR_DEPARTMENT_OTHER): Payer: Self-pay | Admitting: Cardiovascular Disease

## 2024-02-25 VITALS — BP 154/62 | HR 74 | Ht 60.0 in | Wt 136.0 lb

## 2024-02-25 DIAGNOSIS — I10 Essential (primary) hypertension: Secondary | ICD-10-CM | POA: Diagnosis not present

## 2024-02-25 DIAGNOSIS — E785 Hyperlipidemia, unspecified: Secondary | ICD-10-CM

## 2024-02-25 DIAGNOSIS — R002 Palpitations: Secondary | ICD-10-CM | POA: Diagnosis not present

## 2024-02-25 DIAGNOSIS — Z5181 Encounter for therapeutic drug level monitoring: Secondary | ICD-10-CM

## 2024-02-25 DIAGNOSIS — E7800 Pure hypercholesterolemia, unspecified: Secondary | ICD-10-CM | POA: Diagnosis not present

## 2024-02-25 NOTE — Progress Notes (Unsigned)
 Patient enrolled for [facility] [employer] to ship a 30 day cardiac event monitor to her home. Requested sensitive skin Hydrocolloid strips.

## 2024-02-25 NOTE — Patient Instructions (Signed)
 Medication Instructions:  RESTART THE hydrochlorothiazide  DAILY  *If you need a refill on your cardiac medications before your next appointment, please call your pharmacy*  Lab Work: FASTING LP/CMET IN 1 WEEK  If you have labs (blood work) drawn today and your tests are completely normal, you will receive your results only by: MyChart Message (if you have MyChart) OR A paper copy in the mail If you have any lab test that is abnormal or we need to change your treatment, we will call you to review the results.  Testing/Procedures: Your physician has recommended that you wear an event monitor. Event monitors are medical devices that record the heart's electrical activity. Doctors most often us  these monitors to diagnose arrhythmias. Arrhythmias are problems with the speed or rhythm of the heartbeat. The monitor is a small, portable device. You can wear one while you do your normal daily activities. This is usually used to diagnose what is causing palpitations/syncope (passing out). 30 DAY   Follow-Up: At [facility], you and your health needs are our priority.  As part of our continuing mission to provide you with exceptional heart care, our providers are all part of one team.  This team includes your primary Cardiologist (physician) and Advanced Practice Providers or APPs (Physician Assistants and Nurse Practitioners) who all work together to provide you with the care you need, when you need it.  Your next appointment:   2 TO 3 month(s)  Provider:   Annabella Scarce, MD, Rosaline Bane, NP, or Reche Finder, NP     We recommend signing up for the patient portal called MyChart.  Sign up information is provided on this After Visit Summary.  MyChart is used to connect with patients for Virtual Visits (Telemedicine).  Patients are able to view lab/test results, encounter notes, upcoming appointments, etc.  Non-urgent messages can be sent to your provider as well.   To learn more  about what you can do with MyChart, go to [email or web address].   Other Instructions  [employer] Cardiac Event Monitor Instructions  Your physician has requested you wear your cardiac event monitor for _____ days, (1-30). [employer] may call or text to confirm a shipping address. The monitor will be sent to a land address via UPS. [employer] will not ship a monitor to a PO BOX. It typically takes 3-5 days to receive your monitor after it has been enrolled. [employer] will assist with USPS tracking if your package is delayed. The telephone number for [employer] is [phone]. Once you have received your monitor, please review the enclosed instructions. Instruction tutorials can also be viewed under help and settings on the enclosed cell phone. Your monitor has already been registered assigning a specific monitor serial # to you.  Billing and Self Pay Discount Information  [employer] has been provided the insurance information we had on file for you.  If your insurance has been updated, please call [employer] at [phone] to provide them with your updated insurance information.   [employer] offers a discounted Self Pay option for patients who have insurance that does not cover their cardiac event monitor or patients without insurance.  The discounted cost of a Self Pay Cardiac Event Monitor would be $225.00 , if the patient contacts [employer] at [phone] within 7 days of applying the monitor to make payment arrangements.  If the patient does not contact [employer] within 7 days of applying the monitor, the cost of the cardiac event monitor will be $350.00.  Applying the monitor  Remove cell  phone from case and turn it on. The cell phone works as IT consultant and needs to be within [employer] of you at all times. The cell phone will need to be charged on a daily basis. We recommend you plug the cell phone into the enclosed charger at your bedside table every night.  Monitor  batteries: You will receive two monitor batteries labelled #1 and #2. These are your recorders. Plug battery #2 onto the second connection on the enclosed charger. Keep one battery on the charger at all times. This will keep the monitor battery deactivated. It will also keep it fully charged for when you need to switch your monitor batteries. A small light will be blinking on the battery emblem when it is charging. The light on the battery emblem will remain on when the battery is fully charged.  Open package of a Monitor strip. Insert battery #1 into black hood on strip and gently squeeze monitor battery onto connection as indicated in instruction booklet. Set aside while preparing skin.  Choose location for your strip, vertical or horizontal, as indicated in the instruction booklet. Shave to remove all hair from location. There cannot be any lotions, oils, powders, or colognes on skin where monitor is to be applied. Wipe skin clean with enclosed Saline wipe. Dry skin completely.  Peel paper labeled #1 off the back of the Monitor strip exposing the adhesive. Place the monitor on the chest in the vertical or horizontal position shown in the instruction booklet. One arrow on the monitor strip must be pointing upward. Carefully remove paper labeled #2, attaching remainder of strip to your skin. Try not to create any folds or wrinkles in the strip as you apply it.  Firmly press and release the circle in the center of the monitor battery. You will hear a small beep. This is turning the monitor battery on. The heart emblem on the monitor battery will light up every 5 seconds if the monitor battery in turned on and connected to the patient securely. Do not push and hold the circle down as this turns the monitor battery off. The cell phone will locate the monitor battery. A screen will appear on the cell phone checking the connection of your monitor strip. This may read poor connection initially but  change to good connection within the next minute. Once your monitor accepts the connection you will hear a series of 3 beeps followed by a climbing crescendo of beeps. A screen will appear on the cell phone showing the two monitor strip placement options. Touch the picture that demonstrates where you applied the monitor strip.  Your monitor strip and battery are waterproof. You are able to shower, bathe, or swim with the monitor on. They just ask you do not submerge deeper than 3 feet underwater. We recommend removing the monitor if you are swimming in a lake, river, or ocean.  Your monitor battery will need to be switched to a fully charged monitor battery approximately once a week. The cell phone will alert you of an action which needs to be made.  On the cell phone, tap for details to reveal connection status, monitor battery status, and cell phone battery status. The green dots indicates your monitor is in good status. A red dot indicates there is something that needs your attention.  To record a symptom, click the circle on the monitor battery. In 30-60 seconds a list of symptoms will appear on the cell phone. Select your symptom and tap  save. Your monitor will record a sustained or significant arrhythmia regardless of you clicking the button. Some patients do not feel the heart rhythm irregularities. [employer] will notify us  of any serious or critical events.  Refer to instruction booklet for instructions on switching batteries, changing strips, the Do not disturb or Pause features, or any additional questions.  Call [employer] at [phone], to confirm your monitor is transmitting and record your baseline. They will answer any questions you may have regarding the monitor instructions at that time.  Returning the monitor to [employer]  Place all equipment back into blue box. Peel off strip of paper to expose adhesive and close box securely. There is a prepaid UPS shipping  label on this box. Drop in a UPS drop box, or at a UPS facility like [employer]. You may also contact [employer] to arrange UPS to pick up monitor package at your home.

## 2024-02-25 NOTE — Progress Notes (Signed)
 Cardiology Office Note:  .   Date:  02/25/2024  ID:  Desiree Yu, DOB [birth year], MRN [id number] PCP: Desiree Norleen BROCKS, MD  Desiree Yu Providers Cardiologist:  None    History of Present Illness: .    Desiree Yu is a 63 y.o. female with hypertension, hyperlipidemia, and mildly dilated aorta here to establish care.  She was previously a patient of Dr. Burnard.  He has been working with her on blood pressure control and to struggle with labile blood pressures.  She was last seen 06/2023 and blood pressure was well-controlled in the office.  Echo 04/2023 revealed LVEF 60-65% with grade 1 diastolic dysfunction.  Her ascending aorta was 4.0 cm.  Discussed the use of AI scribe software for clinical note transcription with the patient, who gave verbal consent to proceed.  History of Present Illness Ms. Bahner experiences nocturnal palpitations occurring one to two times a month, lasting about 15 to 20 minutes, often waking her up. These episodes typically occur after returning to bed from the bathroom. No palpitations during the day, and she does not experience lightheadedness or dizziness during these episodes.  Her hypertension was previously managed with hydrochlorothiazide , which she discontinued after her blood pressure readings lowered. She is currently on 50 mg of losartan . Her home blood pressure readings are around 130/70-80 mmHg, previously higher with diastolic readings reaching the 90s. She engages in regular physical activity, walking briskly for an hour to an hour and a half, three to four times a week, and practices yoga two to three times a week. She experiences no chest pain or dyspnea during exercise.  She has a history of acid reflux and a small hernia, previously managed with prescription-strength medication, but has not taken it in about a year. Occasionally, she experiences mild reflux and takes over-the-counter medication as needed. She reports occasional snoring,  attributed to sinus issues, and recently underwent sinuplasty surgery, still recovering with some congestion.  Her family history includes hypertension in her mother and sister. She uses Claritin D a couple of times a week due to sinus congestion.  She takes atorvastatin  for cholesterol management and CoQ10 supplements. She also has trazodone  25 mg for occasional use when she has trouble sleeping, though she has not needed it much recently.   ROS:  As per HPI  Studies Reviewed: .       Echo 12/204: 1. Left ventricular ejection fraction, by estimation, is 65 to 70%. Left  ventricular ejection fraction by 3D volume is 65 %. The left ventricle has  normal function. The left ventricle has no regional wall motion  abnormalities. Left ventricular diastolic   parameters are consistent with Grade I diastolic dysfunction (impaired  relaxation).   2. Right ventricular systolic function is normal. The right ventricular  size is normal. Tricuspid regurgitation signal is inadequate for assessing  PA pressure.   3. The mitral valve is normal in structure. No evidence of mitral valve  regurgitation. No evidence of mitral stenosis.   4. The aortic valve was not well visualized. Aortic valve regurgitation  is not visualized. No aortic stenosis is present.   5. Aortic dilatation noted. There is dilatation of the ascending aorta,  measuring 40 mm.   6. The inferior vena cava is normal in size with greater than 50%  respiratory variability, suggesting right atrial pressure of 3 mmHg.      Risk Assessment/Calculations:     HYPERTENSION CONTROL Vitals:   02/25/24 1125 02/25/24 1305  BP: (!) 140/80 (!) 154/62    The patient's blood pressure is elevated above target today.  In order to address the patient's elevated BP: A new medication was prescribed today.          Physical Exam:   VS:  BP (!) 154/62   Pulse 74   Ht 5' (1.524 m)   Wt 136 lb (61.7 kg)   BMI 26.56 kg/m  , BMI Body mass  index is 26.56 kg/m. GENERAL:  Well appearing HEENT: Pupils equal round and reactive, fundi not visualized, oral mucosa unremarkable NECK:  No jugular venous distention, waveform within normal limits, carotid upstroke brisk and symmetric, no bruits, no thyromegaly LUNGS:  Clear to auscultation bilaterally HEART:  RRR.  PMI not displaced or sustained,S1 and S2 within normal limits, no S3, no S4, no clicks, no rubs, no murmurs ABD:  Flat, positive bowel sounds normal in frequency in pitch, no bruits, no rebound, no guarding, no midline pulsatile mass, no hepatomegaly, no splenomegaly EXT:  2 plus pulses throughout, no edema, no cyanosis no clubbing SKIN:  No rashes no nodules NEURO:  Cranial nerves II through XII grossly intact, motor grossly intact throughout PSYCH:  Cognitively intact, oriented to person place and time   ASSESSMENT AND PLAN: .    Assessment & Plan # Palpitations Intermittent nocturnal palpitations, 1-2 times/month, lasting 15-20 minutes. Differential includes sleep apnea and acid reflux, but timing reduces likelihood. No associated lightheadedness or dizziness. - Order 30-day heart monitor to capture rhythm during palpitations.  Rule out atrial fibrillation. - Instruct her to note any correlation between palpitations and factors such as meal timing or Claritin D use.  # Primary hypertension Blood pressure improved with losartan  50 mg. Hydrochlorothiazide  was discontinued due to low readings, but BP increased to 130s/80s. Discussed maintaining BP under 130/80 to reduce cardiovascular risks. - Restart hydrochlorothiazide  12.5 mg daily. - Consider combination pill of losartan  and hydrochlorothiazide  if she desires fewer pills. - Instruct her to monitor blood pressure at home, aiming for readings under 130/80.  # Hyperlipidemia Managed with atorvastatin . Last lipid panel in April 2024. - Order fasting lipid panel. - Ensure atorvastatin  prescription is up to date.          Dispo: f/u 2-3 months  Signed, Annabella Scarce, MD

## 2024-03-04 ENCOUNTER — Encounter (HOSPITAL_BASED_OUTPATIENT_CLINIC_OR_DEPARTMENT_OTHER): Payer: Self-pay | Admitting: Cardiovascular Disease

## 2024-03-04 DIAGNOSIS — R002 Palpitations: Secondary | ICD-10-CM | POA: Diagnosis not present

## 2024-03-04 DIAGNOSIS — Z5181 Encounter for therapeutic drug level monitoring: Secondary | ICD-10-CM | POA: Diagnosis not present

## 2024-03-04 DIAGNOSIS — E785 Hyperlipidemia, unspecified: Secondary | ICD-10-CM | POA: Diagnosis not present

## 2024-03-04 DIAGNOSIS — I10 Essential (primary) hypertension: Secondary | ICD-10-CM | POA: Diagnosis not present

## 2024-03-05 LAB — COMPREHENSIVE METABOLIC PANEL WITH GFR
ALT: 14 IU/L (ref 0–32)
AST: 16 IU/L (ref 0–40)
Albumin: 4.6 g/dL (ref 3.9–4.9)
Alkaline Phosphatase: 84 IU/L (ref 49–135)
BUN/Creatinine Ratio: 26 (ref 12–28)
BUN: 16 mg/dL (ref 8–27)
Bilirubin Total: 0.5 mg/dL (ref 0.0–1.2)
CO2: 21 mmol/L (ref 20–29)
Calcium: 9.4 mg/dL (ref 8.7–10.3)
Chloride: 103 mmol/L (ref 96–106)
Creatinine, Ser: 0.61 mg/dL (ref 0.57–1.00)
Globulin, Total: 2.2 g/dL (ref 1.5–4.5)
Glucose: 95 mg/dL (ref 70–99)
Potassium: 4.4 mmol/L (ref 3.5–5.2)
Sodium: 139 mmol/L (ref 134–144)
Total Protein: 6.8 g/dL (ref 6.0–8.5)
eGFR: 99 mL/min/1.73 (ref >59)

## 2024-03-05 LAB — LIPID PANEL
Chol/HDL Ratio: 2.3 ratio (ref 0.0–4.4)
Cholesterol, Total: 167 mg/dL (ref 100–199)
HDL: 73 mg/dL (ref >39)
LDL Chol Calc (NIH): 78 mg/dL (ref 0–99)
Triglycerides: 88 mg/dL (ref 0–149)
VLDL Cholesterol Cal: 16 mg/dL (ref 5–40)

## 2024-03-14 DIAGNOSIS — J310 Chronic rhinitis: Secondary | ICD-10-CM | POA: Diagnosis not present

## 2024-03-14 DIAGNOSIS — R519 Headache, unspecified: Secondary | ICD-10-CM | POA: Diagnosis not present

## 2024-03-14 DIAGNOSIS — H04563 Stenosis of bilateral lacrimal punctum: Secondary | ICD-10-CM | POA: Diagnosis not present

## 2024-03-14 DIAGNOSIS — R4689 Other symptoms and signs involving appearance and behavior: Secondary | ICD-10-CM | POA: Diagnosis not present

## 2024-03-14 DIAGNOSIS — H0520 Unspecified exophthalmos: Secondary | ICD-10-CM | POA: Diagnosis not present

## 2024-03-14 DIAGNOSIS — H04203 Unspecified epiphora, bilateral lacrimal glands: Secondary | ICD-10-CM | POA: Diagnosis not present

## 2024-03-25 ENCOUNTER — Ambulatory Visit: Payer: Self-pay | Admitting: Cardiovascular Disease

## 2024-03-28 NOTE — Progress Notes (Signed)
   03/28/2024  Patient ID: Desiree Yu, female   DOB: [birth year], 63 y.o.   MRN: [id number]  Pharmacy Quality Measure Review  This patient is appearing on a report for being at risk of failing the adherence measure for hypertension (ACEi/ARB) medications this calendar year.   Medication: Losartan  Last fill date: 03/05/24 for 90 day supply  Insurance report was not up to date. No action needed at this time.   Desiree Yu, PharmD Clinical Pharmacist [phone]

## 2024-04-08 ENCOUNTER — Encounter (INDEPENDENT_AMBULATORY_CARE_PROVIDER_SITE_OTHER): Payer: Self-pay | Admitting: Otolaryngology

## 2024-04-08 ENCOUNTER — Ambulatory Visit (INDEPENDENT_AMBULATORY_CARE_PROVIDER_SITE_OTHER): Admitting: Otolaryngology

## 2024-04-08 ENCOUNTER — Ambulatory Visit: Attending: Cardiovascular Disease

## 2024-04-08 VITALS — BP 111/75 | HR 78 | Temp 98.6°F | Ht 60.0 in | Wt 134.0 lb

## 2024-04-08 DIAGNOSIS — R220 Localized swelling, mass and lump, head: Secondary | ICD-10-CM | POA: Diagnosis not present

## 2024-04-08 DIAGNOSIS — R002 Palpitations: Secondary | ICD-10-CM

## 2024-04-08 DIAGNOSIS — R519 Headache, unspecified: Secondary | ICD-10-CM | POA: Diagnosis not present

## 2024-04-08 DIAGNOSIS — J0101 Acute recurrent maxillary sinusitis: Secondary | ICD-10-CM | POA: Insufficient documentation

## 2024-04-08 MED ORDER — AMOXICILLIN-POT CLAVULANATE 875-125 MG PO TABS: 1.0000 | ORAL_TABLET | Freq: Two times a day (BID) | ORAL | 0 refills | Status: AC

## 2024-04-08 NOTE — Progress Notes (Signed)
 Patient ID: Desiree Yu, female   DOB: 1960/09/25, 63 y.o.   MRN: [id number]  New complaints: Left maxillary sinusitis, left facial pain and swelling  History of Present Illness Desiree Yu is a 63 year old female who presents with left-sided facial pain and swelling.  She suspects left maxillary sinusitis.  She has been experiencing left-sided facial pain and nasal congestion for the past three to four weeks. The pain is localized to the left maxillary region and is most pronounced in the morning, accompanied by drainage. There is a sensation of swelling inside her face, with yellow nasal discharge and significant congestion, particularly at night. The symptoms have progressively worsened over time.  Three weeks ago, she visited her ocular doctor, who noted some swelling on the left side, but it was not as severe at that time. She has been using eye drops prescribed by her ocular doctor, but they have not alleviated her symptoms.  Her nasal breathing has currently she is able to breathe through both nostrils.  Exam: General: Communicates without difficulty, well nourished, no acute distress. Head: Normocephalic, no evidence injury, no tenderness, facial buttresses intact without stepoff. Face/sinus: No tenderness to palpation and percussion. Facial movement is normal and symmetric. Eyes: PERRL, EOMI. No scleral icterus, conjunctivae clear. Neuro: CN II exam reveals vision grossly intact.  No nystagmus at any point of gaze. Ears: Auricles well formed without lesions.  Ear canals are intact without mass or lesion.  No erythema or edema is appreciated.  The TMs are intact without fluid. Nose: External evaluation reveals normal support and skin without lesions.  Dorsum is intact.  Anterior rhinoscopy reveals congested mucosa over anterior aspect of inferior turbinates and intact septum.  Mild erythema noted.  Oral:  Oral cavity and oropharynx are intact, symmetric, without erythema or edema.  Mucosa is  moist without lesions. Neck: Full range of motion without pain.  There is no significant lymphadenopathy.  No masses palpable.  Thyroid bed within normal limits to palpation.  Parotid glands and submandibular glands equal bilaterally without mass.  Trachea is midline. Neuro:  CN 2-12 grossly intact.   Assessment & Plan Acute left maxillary sinusitis Suspected due to left-sided facial pain and facial swelling, particularly in the left maxillary sinus area. Symptoms have persisted for three to four weeks and are progressively worsening. - The physical exam findings are extensively discussed with the patient. - Prescribed Augmentin  for one week, twice daily.  The possible side effects are discussed. - If symptoms persist after one week, will order CT scan of the sinuses. - If her symptoms resolve, she will return for reevaluation in April 2026.

## 2024-04-14 DIAGNOSIS — H0520 Unspecified exophthalmos: Secondary | ICD-10-CM | POA: Diagnosis not present

## 2024-04-14 DIAGNOSIS — R519 Headache, unspecified: Secondary | ICD-10-CM | POA: Diagnosis not present

## 2024-04-14 DIAGNOSIS — R4689 Other symptoms and signs involving appearance and behavior: Secondary | ICD-10-CM | POA: Diagnosis not present

## 2024-04-14 DIAGNOSIS — J310 Chronic rhinitis: Secondary | ICD-10-CM | POA: Diagnosis not present

## 2024-04-14 DIAGNOSIS — H04203 Unspecified epiphora, bilateral lacrimal glands: Secondary | ICD-10-CM | POA: Diagnosis not present

## 2024-04-25 ENCOUNTER — Telehealth (INDEPENDENT_AMBULATORY_CARE_PROVIDER_SITE_OTHER): Payer: Self-pay

## 2024-04-25 NOTE — Telephone Encounter (Signed)
 Patient called 04/25/2024 12:15 PM patient stated they are still having issues with their sinuses and Dr. Karis told them to call to get a CT ordered, if they still had issues.

## 2024-04-28 ENCOUNTER — Other Ambulatory Visit (INDEPENDENT_AMBULATORY_CARE_PROVIDER_SITE_OTHER): Payer: Self-pay | Admitting: Physician Assistant

## 2024-04-28 DIAGNOSIS — J0101 Acute recurrent maxillary sinusitis: Secondary | ICD-10-CM

## 2024-04-28 DIAGNOSIS — J324 Chronic pansinusitis: Secondary | ICD-10-CM

## 2024-04-28 DIAGNOSIS — R002 Palpitations: Secondary | ICD-10-CM | POA: Diagnosis not present

## 2024-04-28 NOTE — Progress Notes (Signed)
 CT order placed for ongoing sinus symptoms despite medical management

## 2024-04-28 NOTE — Telephone Encounter (Signed)
 Returned patient call regarding  ordering  a Sinus CT. I left her a voice mail,that dr. Karis is out of the office and I will  ask another provider to order her sinus scan.

## 2024-05-16 ENCOUNTER — Encounter (HOSPITAL_BASED_OUTPATIENT_CLINIC_OR_DEPARTMENT_OTHER): Payer: Self-pay | Admitting: Family

## 2024-05-16 ENCOUNTER — Ambulatory Visit (HOSPITAL_BASED_OUTPATIENT_CLINIC_OR_DEPARTMENT_OTHER): Admitting: Family

## 2024-05-16 VITALS — BP 128/72 | HR 74 | Ht 60.0 in | Wt 139.5 lb

## 2024-05-16 DIAGNOSIS — I10 Essential (primary) hypertension: Secondary | ICD-10-CM | POA: Diagnosis not present

## 2024-05-16 DIAGNOSIS — R002 Palpitations: Secondary | ICD-10-CM

## 2024-05-16 DIAGNOSIS — E785 Hyperlipidemia, unspecified: Secondary | ICD-10-CM

## 2024-05-16 DIAGNOSIS — Z5181 Encounter for therapeutic drug level monitoring: Secondary | ICD-10-CM | POA: Diagnosis not present

## 2024-05-16 DIAGNOSIS — E7800 Pure hypercholesterolemia, unspecified: Secondary | ICD-10-CM | POA: Diagnosis not present

## 2024-05-16 NOTE — Progress Notes (Unsigned)
 " Cardiology Office Note   Date:  05/16/2024  ID:  Desiree Yu, DOB [birth year], MRN [id number] PCP: Joyce Norleen BROCKS, MD  [facility] Providers Cardiologist:  None { Click to update primary MD,subspecialty MD or APP then REFRESH:1}    History of Present Illness Desiree Yu is a 64 y.o. female with history of hyperlipidemia, hypertension.  At visit 02/25/2024 reported intermittent palpitations.  Her BP is not at goal less than 130/80 and HCTZ 12.5 mg resumed, losartan  50 mg daily continued.  30-day monitor revealed predominantly normal sinus rhythm average heart rate 80 bpm, less than 1% PAC/PVC and no significant arrhythmias.  Presents today for follow up. Only palpitations with going to the bathroom in the middle of night. This is overall not bothersome. She checks BP at home intermittently. With BP readings at home 120-130s/70-80s when checked with her upper arm cuff. Reports no shortness of breath nor dyspnea on exertion. Reports no chest pain, pressure, or tightness. No edema, orthopnea, PND.  ROS: Please see the history of present illness.    All other systems reviewed and are negative.   Studies Reviewed EKG Interpretation Date/Time:  Friday May 16 2024 15:19:13 EST Ventricular Rate:  74 PR Interval:  192 QRS Duration:  84 QT Interval:  394 QTC Calculation: 437 R Axis:   74  Text Interpretation: Normal sinus rhythm Normal ECG Confirmed by Vannie Mora ([id number]) on 05/16/2024 3:22:47 PM    Cardiac Studies & Procedures   ______________________________________________________________________________________________     ECHOCARDIOGRAM  ECHOCARDIOGRAM COMPLETE 04/30/2023  Narrative ECHOCARDIOGRAM REPORT    Patient Name:   Desiree Yu Date of Exam: 04/30/2023 Medical Rec #:  [id number]     Height:       60.0 in Accession #:    [id number]    Weight:       140.0 lb Date of Birth:  [birth year]     BSA:          1.604 m Patient Age:    62 years      BP:            85/148 mmHg Patient Gender: F             HR:           86 bpm. Exam Location:  [address]  Procedure: 2D Echo, 3D Echo, Cardiac Doppler, Color Doppler and Strain Analysis  Indications:    I25.10 CAD  History:        Patient has no prior history of Echocardiogram examinations. Risk Factors:Hypertension and Dyslipidemia.  Sonographer:    Nolon Berg BA, RDCS Referring Phys: [phone] THOMAS A KELLY  IMPRESSIONS   1. Left ventricular ejection fraction, by estimation, is 65 to 70%. Left ventricular ejection fraction by 3D volume is 65 %. The left ventricle has normal function. The left ventricle has no regional wall motion abnormalities. Left ventricular diastolic parameters are consistent with Grade I diastolic dysfunction (impaired relaxation). 2. Right ventricular systolic function is normal. The right ventricular size is normal. Tricuspid regurgitation signal is inadequate for assessing PA pressure. 3. The mitral valve is normal in structure. No evidence of mitral valve regurgitation. No evidence of mitral stenosis. 4. The aortic valve was not well visualized. Aortic valve regurgitation is not visualized. No aortic stenosis is present. 5. Aortic dilatation noted. There is dilatation of the ascending aorta, measuring 40 mm. 6. The inferior vena cava is normal in size with greater than 50% respiratory variability, suggesting right  atrial pressure of 3 mmHg.  FINDINGS Left Ventricle: Left ventricular ejection fraction, by estimation, is 65 to 70%. Left ventricular ejection fraction by 3D volume is 65 %. The left ventricle has normal function. The left ventricle has no regional wall motion abnormalities. The left ventricular internal cavity size was normal in size. There is no left ventricular hypertrophy. Left ventricular diastolic parameters are consistent with Grade I diastolic dysfunction (impaired relaxation).  Right Ventricle: The right ventricular size is normal. No increase in  right ventricular wall thickness. Right ventricular systolic function is normal. Tricuspid regurgitation signal is inadequate for assessing PA pressure.  Left Atrium: Left atrial size was normal in size.  Right Atrium: Right atrial size was normal in size.  Pericardium: Trivial pericardial effusion is present.  Mitral Valve: The mitral valve is normal in structure. No evidence of mitral valve regurgitation. No evidence of mitral valve stenosis.  Tricuspid Valve: The tricuspid valve is normal in structure. Tricuspid valve regurgitation is trivial.  Aortic Valve: The aortic valve was not well visualized. Aortic valve regurgitation is not visualized. No aortic stenosis is present.  Pulmonic Valve: The pulmonic valve was not well visualized. Pulmonic valve regurgitation is not visualized.  Aorta: Aortic dilatation noted. There is dilatation of the ascending aorta, measuring 40 mm.  Venous: The inferior vena cava is normal in size with greater than 50% respiratory variability, suggesting right atrial pressure of 3 mmHg.  IAS/Shunts: The interatrial septum was not well visualized.   LEFT VENTRICLE PLAX 2D LVIDd:         3.72 cm         Diastology LVIDs:         2.36 cm         LV e' medial:    4.57 cm/s LV PW:         0.95 cm         LV E/e' medial:  14.3 LV IVS:        1.01 cm         LV e' lateral:   6.09 cm/s LVOT diam:     2.30 cm         LV E/e' lateral: 10.8 LV SV:         96 LV SV Index:   60 LVOT Area:     4.15 cm        3D Volume EF LV 3D EF:    Left ventricul ar ejection fraction by 3D volume is 65 %.  3D Volume EF: 3D EF:        65 % LV EDV:       98 ml LV ESV:       34 ml LV SV:        65 ml  RIGHT VENTRICLE          IVC RV Basal diam:  2.46 cm  IVC diam: 1.10 cm RV Mid diam:    1.81 cm  LEFT ATRIUM             Index        RIGHT ATRIUM           Index LA Vol (A2C):   38.2 ml 23.81 ml/m  RA Area:     12.90 cm LA Vol (A4C):   39.3 ml 24.50 ml/m  RA  Volume:   31.10 ml  19.39 ml/m LA Biplane Vol: 38.7 ml 24.13 ml/m AORTIC VALVE LVOT Vmax:   116.00 cm/s LVOT Vmean:  76.500  cm/s LVOT VTI:    0.230 m  AORTA Ao Root diam: 3.30 cm Ao Asc diam:  4.00 cm  MV E velocity: 65.50 cm/s MV A velocity: 83.30 cm/s  SHUNTS MV E/A ratio:  0.79        Systemic VTI:  0.23 m Systemic Diam: 2.30 cm  Lonni Nanas MD Electronically signed by Lonni Nanas MD Signature Date/Time: 04/30/2023/8:06:12 PM    Final    MONITORS  CARDIAC EVENT MONITOR 04/08/2024  Narrative 30 Day Event Monitor  Quality: Fair.  Baseline artifact. Predominant rhythm: Sinus rhythm Average heart rate: 80 bpm Max heart rate: 131 bpm Min heart rate: 59 bpm Pauses >2.5 seconds: none  Rare (<1%) PACs and PVCs  No arrhythmias noted.  Tiffany C. Raford, MD, [facility] 04/28/2024 5:42 PM       ______________________________________________________________________________________________      Risk Assessment/Calculations           Physical Exam VS:  BP 128/72   Pulse 74   Ht 5' (1.524 m)   Wt 139 lb 8 oz (63.3 kg)   SpO2 99%   BMI 27.24 kg/m        Wt Readings from Last 3 Encounters:  05/16/24 139 lb 8 oz (63.3 kg)  04/08/24 134 lb (60.8 kg)  02/25/24 136 lb (61.7 kg)    GEN: Well nourished, well developed in no acute distress NECK: No JVD; No carotid bruits CARDIAC: RRR, no murmurs, rubs, gallops RESPIRATORY:  Clear to auscultation without rales, wheezing or rhonchi  ABDOMEN: Soft, non-tender, non-distended EXTREMITIES:  No edema; No deformity   ASSESSMENT AND PLAN  Palpitations-monitor 02/2024 predominantly normal sinus rhythm with no significant arrhythmias. Occasional palpitations when walking to the bathroom in the middle of hte night. This is overall not bothersome.   HTN- BP controlled by home readings. Continue Losartan  50mg  daily, hydrochlorothiazide  12.5mg  daily. Discussed to monitor BP at home at least 2 hours after  medications and sitting for 5-10 minutes.   HLD-02/25/2024 TC 167, TG 88, HDL 73, LDL 78.  Continue atorvastatin  20 mg daily.       Dispo: follow up in 1 year  Signed, Reche GORMAN Finder, NP  "

## 2024-05-16 NOTE — Patient Instructions (Addendum)
 Medication Instructions:  Continue your current medications   *If you need a refill on your cardiac medications before your next appointment, please call your pharmacy*  Lab Work: Your cholesterol in October looked fantastic!  Testing/Procedures: Your heart monitor showed a normal heart rhythm. There were no significant arrhythmias which is a great result!  Follow-Up: At [facility], you and your health needs are our priority.  As part of our continuing mission to provide you with exceptional heart care, our providers are all part of one team.  This team includes your primary Cardiologist (physician) and Advanced Practice Providers or APPs (Physician Assistants and Nurse Practitioners) who all work together to provide you with the care you need, when you need it.  Your next appointment:   1 year(s)  Provider:   Annabella Scarce, MD, Rosaline Bane, NP, or Reche Finder, NP    We recommend signing up for the patient portal called MyChart.  Sign up information is provided on this After Visit Summary.  MyChart is used to connect with patients for Virtual Visits (Telemedicine).  Patients are able to view lab/test results, encounter notes, upcoming appointments, etc.  Non-urgent messages can be sent to your provider as well.   To learn more about what you can do with MyChart, go to [email or web address].   Other Instructions    Heart Healthy Diet Recommendations: A low-salt diet is recommended. Meats should be grilled, baked, or boiled. Avoid fried foods. Focus on lean protein sources like fish or chicken with vegetables and fruits. The American Heart Association is a Chief Technology Officer!  American Heart Association Diet and Lifeystyle Recommendations   Exercise recommendations: The American Heart Association recommends 150 minutes of moderate intensity exercise weekly. Try 30 minutes of moderate intensity exercise 4-5 times per week. This could include walking, jogging, or  swimming.

## 2024-05-20 ENCOUNTER — Ambulatory Visit (HOSPITAL_COMMUNITY)
Admission: RE | Admit: 2024-05-20 | Discharge: 2024-05-20 | Disposition: A | Discharge status: Home / Self Care | Source: Ambulatory Visit | Attending: Physician Assistant | Admitting: Physician Assistant

## 2024-05-20 DIAGNOSIS — J0101 Acute recurrent maxillary sinusitis: Secondary | ICD-10-CM | POA: Insufficient documentation

## 2024-05-20 DIAGNOSIS — J324 Chronic pansinusitis: Secondary | ICD-10-CM | POA: Diagnosis present

## 2024-08-08 ENCOUNTER — Ambulatory Visit (INDEPENDENT_AMBULATORY_CARE_PROVIDER_SITE_OTHER): Admitting: Otolaryngology

## 2024-09-18 ENCOUNTER — Ambulatory Visit: Admitting: Family Medicine

## 2024-10-21 ENCOUNTER — Ambulatory Visit: Payer: Self-pay | Admitting: Family Medicine
